# Patient Record
Sex: Male | Born: 1980 | Race: White | Hispanic: No | Marital: Married | Smoking: Never smoker
Health system: Southern US, Community
[De-identification: ages and names within clinical notes are randomized; demographics above are authoritative.]

## PROBLEM LIST (undated history)

## (undated) DIAGNOSIS — K5190 Ulcerative colitis, unspecified, without complications: Secondary | ICD-10-CM

## (undated) DIAGNOSIS — L409 Psoriasis, unspecified: Secondary | ICD-10-CM

## (undated) HISTORY — PX: OTHER SURGICAL HISTORY: SHX169

## (undated) HISTORY — DX: Ulcerative colitis, unspecified, without complications: K51.90

## (undated) HISTORY — DX: Psoriasis, unspecified: L40.9

---
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
## 2004-02-11 ENCOUNTER — Emergency Department (HOSPITAL_COMMUNITY): Admission: EM | Admit: 2004-02-11 | Discharge: 2004-02-12 | Payer: Self-pay | Admitting: Emergency Medicine

## 2013-07-13 ENCOUNTER — Encounter (HOSPITAL_COMMUNITY): Payer: Self-pay | Admitting: Emergency Medicine

## 2013-07-13 ENCOUNTER — Emergency Department (HOSPITAL_COMMUNITY)
Admission: EM | Admit: 2013-07-13 | Discharge: 2013-07-13 | Disposition: A | Discharge status: Home / Self Care | Payer: Self-pay | Attending: Emergency Medicine | Admitting: Emergency Medicine

## 2013-07-13 DIAGNOSIS — N4829 Other inflammatory disorders of penis: Secondary | ICD-10-CM | POA: Insufficient documentation

## 2013-07-13 DIAGNOSIS — N4822 Cellulitis of corpus cavernosum and penis: Secondary | ICD-10-CM

## 2013-07-13 DIAGNOSIS — R42 Dizziness and giddiness: Secondary | ICD-10-CM | POA: Insufficient documentation

## 2013-07-13 DIAGNOSIS — R69 Illness, unspecified: Secondary | ICD-10-CM

## 2013-07-13 DIAGNOSIS — J111 Influenza due to unidentified influenza virus with other respiratory manifestations: Secondary | ICD-10-CM | POA: Insufficient documentation

## 2013-07-13 MED ORDER — NAPROXEN 500 MG PO TABS: 500.0000 mg | ORAL_TABLET | Freq: Two times a day (BID) | ORAL | Status: DC

## 2013-07-13 MED ORDER — SULFAMETHOXAZOLE-TRIMETHOPRIM 800-160 MG PO TABS: 1.0000 | ORAL_TABLET | Freq: Two times a day (BID) | ORAL | Status: DC

## 2013-07-13 MED ORDER — SULFAMETHOXAZOLE-TMP DS 800-160 MG PO TABS
1.0000 | ORAL_TABLET | Freq: Once | ORAL | Status: AC
  Administered 2013-07-13: 1 via ORAL
  Filled 2013-07-13: qty 1

## 2013-07-13 MED ORDER — NAPROXEN 250 MG PO TABS
500.0000 mg | ORAL_TABLET | Freq: Once | ORAL | Status: AC
  Administered 2013-07-13: 500 mg via ORAL
  Filled 2013-07-13: qty 2

## 2013-07-13 NOTE — Discharge Instructions (Signed)
[facility] ° ° ° °Edward Hawkins MD. Specialty: Pulmonary Disease Contact information: [address]  °[address]  °[place] [address]  °[phone]  ° °Margaret Simpson, MD. Specialty: Family Medicine Contact information: [address]  °[place] [address]  °[phone]  ° °Scott Luking, MD. Specialty: Family Medicine Contact information: [address]  °[address]  °[place] [address]  °[phone]  ° °Tesfaye Fanta, MD Specialty: Internal Medicine Contact information: [address]  °[place] [address]  °[phone]  ° °Zach Hall, MD. Specialty: Internal Medicine Contact information: [address]  °[place] [address]  °[phone]  ° °Angus Mcinnis, MD. Specialty: Family Medicine Contact information: [address]  °[place] [address]  °[phone]  ° °Stephen Knowlton, MD. Specialty: Family Medicine Contact information: [address]  °[address]  °[place] [address]  °[phone]  ° °Roy Fagan, MD. Specialty: Internal Medicine Contact information: [address]  °[address]  °[place] [address]  °[phone]  ° ° °

## 2013-07-13 NOTE — ED Notes (Signed)
General malaise, chills, dizziness, and cough began last night.  Noticed swelling in his genitals about 4 hours ago.  Denies nausea, vomiting and diarrhea, denies rash.
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
## 2016-10-21 ENCOUNTER — Emergency Department (HOSPITAL_COMMUNITY): Payer: Self-pay

## 2016-10-21 ENCOUNTER — Emergency Department (HOSPITAL_COMMUNITY)
Admission: EM | Admit: 2016-10-21 | Discharge: 2016-10-21 | Disposition: A | Discharge status: Home / Self Care | Payer: Self-pay | Attending: Emergency Medicine | Admitting: Emergency Medicine

## 2016-10-21 ENCOUNTER — Encounter (HOSPITAL_COMMUNITY): Payer: Self-pay | Admitting: *Deleted

## 2016-10-21 DIAGNOSIS — Y929 Unspecified place or not applicable: Secondary | ICD-10-CM | POA: Insufficient documentation

## 2016-10-21 DIAGNOSIS — X58XXXA Exposure to other specified factors, initial encounter: Secondary | ICD-10-CM | POA: Insufficient documentation

## 2016-10-21 DIAGNOSIS — Y990 Civilian activity done for income or pay: Secondary | ICD-10-CM | POA: Insufficient documentation

## 2016-10-21 DIAGNOSIS — Y939 Activity, unspecified: Secondary | ICD-10-CM | POA: Insufficient documentation

## 2016-10-21 DIAGNOSIS — T63311A Toxic effect of venom of black widow spider, accidental (unintentional), initial encounter: Secondary | ICD-10-CM | POA: Insufficient documentation

## 2016-10-21 DIAGNOSIS — Z79899 Other long term (current) drug therapy: Secondary | ICD-10-CM | POA: Insufficient documentation

## 2016-10-21 DIAGNOSIS — J40 Bronchitis, not specified as acute or chronic: Secondary | ICD-10-CM | POA: Insufficient documentation

## 2016-10-21 LAB — COMPREHENSIVE METABOLIC PANEL
ALBUMIN: 4.6 g/dL (ref 3.5–5.0)
ALT: 40 U/L (ref 17–63)
AST: 30 U/L (ref 15–41)
Alkaline Phosphatase: 72 U/L (ref 38–126)
Anion gap: 9 (ref 5–15)
BUN: 12 mg/dL (ref 6–20)
CO2: 26 mmol/L (ref 22–32)
Calcium: 9.7 mg/dL (ref 8.9–10.3)
Chloride: 105 mmol/L (ref 101–111)
Creatinine, Ser: 1.35 mg/dL — ABNORMAL HIGH (ref 0.61–1.24)
GFR calc Af Amer: >60 mL/min (ref >60)
GFR calc non Af Amer: >60 mL/min (ref >60)
Glucose, Bld: 102 mg/dL — ABNORMAL HIGH (ref 65–99)
POTASSIUM: 4.1 mmol/L (ref 3.5–5.1)
Sodium: 140 mmol/L (ref 135–145)
TOTAL PROTEIN: 8.5 g/dL — AB (ref 6.5–8.1)
Total Bilirubin: 0.7 mg/dL (ref 0.3–1.2)

## 2016-10-21 LAB — CBC WITH DIFFERENTIAL/PLATELET
BASOS PCT: 1 %
Basophils Absolute: 0.1 10*3/uL (ref 0.0–0.1)
EOS PCT: 2 %
Eosinophils Absolute: 0.2 10*3/uL (ref 0.0–0.7)
HCT: 44.5 % (ref 39.0–52.0)
Hemoglobin: 15.2 g/dL (ref 13.0–17.0)
Lymphocytes Relative: 29 %
Lymphs Abs: 3.2 10*3/uL (ref 0.7–4.0)
MCH: 30.4 pg (ref 26.0–34.0)
MCHC: 34.2 g/dL (ref 30.0–36.0)
MCV: 89 fL (ref 78.0–100.0)
MONO ABS: 0.8 10*3/uL (ref 0.1–1.0)
Monocytes Relative: 8 %
Neutro Abs: 6.6 10*3/uL (ref 1.7–7.7)
Neutrophils Relative %: 60 %
Platelets: 278 10*3/uL (ref 150–400)
RBC: 5 MIL/uL (ref 4.22–5.81)
RDW: 13.5 % (ref 11.5–15.5)
WBC: 11 10*3/uL — ABNORMAL HIGH (ref 4.0–10.5)

## 2016-10-21 LAB — I-STAT TROPONIN, ED: Troponin i, poc: 0.01 ng/mL (ref 0.00–0.08)

## 2016-10-21 MED ORDER — LORAZEPAM 2 MG/ML IJ SOLN: 0.5000 mg | Freq: Once | INTRAMUSCULAR | Status: DC

## 2016-10-21 MED ORDER — LORAZEPAM 2 MG/ML IJ SOLN
1.0000 mg | Freq: Once | INTRAMUSCULAR | Status: AC
  Administered 2016-10-21: 1 mg via INTRAVENOUS
  Filled 2016-10-21: qty 1

## 2016-10-21 MED ORDER — SODIUM CHLORIDE 0.9 % IV BOLUS (SEPSIS)
1000.0000 mL | Freq: Once | INTRAVENOUS | Status: AC
Start: 2016-10-21 — End: 2016-10-21
  Administered 2016-10-21: 1000 mL via INTRAVENOUS

## 2016-10-21 MED ORDER — MORPHINE SULFATE (PF) 4 MG/ML IV SOLN: 6.0000 mg | Freq: Once | INTRAVENOUS | Status: DC

## 2016-10-21 MED ORDER — SODIUM CHLORIDE 0.9 % IV BOLUS (SEPSIS)
1000.0000 mL | Freq: Once | INTRAVENOUS | Status: AC
  Administered 2016-10-21: 1000 mL via INTRAVENOUS

## 2016-10-21 MED ORDER — MORPHINE SULFATE (PF) 4 MG/ML IV SOLN
4.0000 mg | Freq: Once | INTRAVENOUS | Status: AC
  Administered 2016-10-21: 4 mg via INTRAVENOUS
  Filled 2016-10-21: qty 1

## 2016-10-21 MED ORDER — CYCLOBENZAPRINE HCL 10 MG PO TABS: 10.0000 mg | ORAL_TABLET | Freq: Three times a day (TID) | ORAL | 0 refills | Status: DC | PRN

## 2016-10-21 MED ORDER — AZITHROMYCIN 250 MG PO TABS: ORAL_TABLET | ORAL | 0 refills | Status: DC

## 2016-10-21 MED ORDER — HYDROCODONE-ACETAMINOPHEN 5-325 MG PO TABS: 1.0000 | ORAL_TABLET | Freq: Four times a day (QID) | ORAL | 0 refills | Status: DC | PRN

## 2016-10-21 MED ORDER — LORAZEPAM 2 MG/ML IJ SOLN
0.5000 mg | Freq: Once | INTRAMUSCULAR | Status: AC
  Administered 2016-10-21: 0.5 mg via INTRAVENOUS
  Filled 2016-10-21: qty 1

## 2016-10-21 MED ORDER — TETANUS-DIPHTH-ACELL PERTUSSIS 5-2.5-18.5 LF-MCG/0.5 IM SUSP
0.5000 mL | Freq: Once | INTRAMUSCULAR | Status: AC
  Administered 2016-10-21: 0.5 mL via INTRAMUSCULAR
  Filled 2016-10-21: qty 0.5

## 2016-10-21 MED ORDER — MORPHINE SULFATE (PF) 4 MG/ML IV SOLN
6.0000 mg | Freq: Once | INTRAVENOUS | Status: AC
  Administered 2016-10-21: 6 mg via INTRAVENOUS
  Filled 2016-10-21: qty 2

## 2016-10-21 MED ORDER — ONDANSETRON HCL 4 MG/2ML IJ SOLN
4.0000 mg | Freq: Once | INTRAMUSCULAR | Status: AC
  Administered 2016-10-21: 4 mg via INTRAVENOUS
  Filled 2016-10-21: qty 2

## 2016-10-21 NOTE — ED Notes (Signed)
Pt ambulatory to waiting room. Pt verbalized understanding of discharge instructions.   

## 2016-10-21 NOTE — Discharge Instructions (Signed)
Follow-up with your family doctor if any problems. Return to the emergency department if necessary. Rest at home tomorrow

## 2016-10-21 NOTE — ED Triage Notes (Signed)
Pt states he was working this morning and went to lunch, he felt something pinch/stick his back and then shook his shirt out. He saw a black widow on the floor. Pt has no bite mark noted. Pt states he is having severe back pain, abdominal pain, and now chest pain. Pt flushed in the face and crying in triage.

## 2016-10-21 NOTE — ED Provider Notes (Signed)
[facility]PT Provider Note   CSN: [id number] Arrival date & time: 10/21/16  1453     History   Chief Complaint Chief Complaint  Patient presents with  . Back Pain    HPI Juan Salazar is a 36 y.o. male.  Patient states that he was bit on the back by a black widow spider. Patient complains of severe pain in his back and abdomen with nausea. Patient stepped on the spider and showed me a picture of it. He was certain it was a black widow   The history is provided by the patient.  Back Pain   This is a new problem. The current episode started 1 to 2 hours ago. The problem occurs constantly. The problem has not changed since onset.Associated with: Insect bite. The pain is present in the lumbar spine. The quality of the pain is described as cramping. The pain does not radiate. The pain is at a severity of 9/10. The pain is severe. The pain is the same all the time. Pertinent negatives include no chest pain, no headaches and no abdominal pain.    History reviewed. No pertinent past medical history.  There are no active problems to display for this patient.   History reviewed. No pertinent surgical history.     Home Medications    Prior to Admission medications   Medication Sig Start Date End Date Taking? Authorizing Provider  dextromethorphan-guaiFENesin (MUCINEX DM) 30-600 MG 12hr tablet Take 1 tablet by mouth 2 (two) times daily as needed for cough.   Yes Historical Provider, MD  azithromycin (ZITHROMAX Z-PAK) 250 MG tablet 2 po day one, then 1 daily x 4 days 10/21/16   Bethann Berkshire, MD  cyclobenzaprine (FLEXERIL) 10 MG tablet Take 1 tablet (10 mg total) by mouth 3 (three) times daily as needed for muscle spasms. 10/21/16   Bethann Berkshire, MD  HYDROcodone-acetaminophen (NORCO/VICODIN) 5-325 MG tablet Take 1 tablet by mouth every 6 (six) hours as needed for moderate pain. 10/21/16   Bethann Berkshire, MD    Family History No family history on file.  Social History Social  History  Substance Use Topics  . Smoking status: Never Smoker  . Smokeless tobacco: Never Used  . Alcohol use Yes     Comment: rarely     Allergies   Patient has no known allergies.   Review of Systems Review of Systems  Constitutional: Negative for appetite change and fatigue.  HENT: Negative for congestion, ear discharge and sinus pressure.   Eyes: Negative for discharge.  Respiratory: Positive for cough.   Cardiovascular: Negative for chest pain.  Gastrointestinal: Negative for abdominal pain and diarrhea.  Genitourinary: Negative for frequency and hematuria.  Musculoskeletal: Positive for back pain.  Skin: Negative for rash.  Neurological: Negative for seizures and headaches.  Psychiatric/Behavioral: Negative for hallucinations.     Physical Exam Updated Vital Signs BP (!) 132/95   Pulse (!) 108   Temp 98.9 F (37.2 C) (Oral)   Resp (!) 29   Ht 5\' 9"  (1.753 m)   Wt 250 lb (113.4 kg)   SpO2 95%   BMI 36.92 kg/m   Physical Exam  Constitutional: He is oriented to person, place, and time. He appears well-developed. He appears distressed.  HENT:  Head: Normocephalic.  Eyes: Conjunctivae and EOM are normal. No scleral icterus.  Neck: Neck supple. No thyromegaly present.  Cardiovascular: Normal rate and regular rhythm.  Exam reveals no gallop and no friction rub.   No murmur heard.  Pulmonary/Chest: No stridor. He has no wheezes. He has no rales. He exhibits no tenderness.  Abdominal: He exhibits no distension. There is no tenderness. There is no rebound.  Musculoskeletal: Normal range of motion. He exhibits no edema.  Lymphadenopathy:    He has no cervical adenopathy.  Neurological: He is oriented to person, place, and time. He exhibits normal muscle tone. Coordination normal.  Skin: No rash noted. No erythema.  Psychiatric: He has a normal mood and affect. His behavior is normal.     ED Treatments / Results  Labs (all labs ordered are listed, but only  abnormal results are displayed) Labs Reviewed  CBC WITH DIFFERENTIAL/PLATELET - Abnormal; Notable for the following:       Result Value   WBC 11.0 (*)    All other components within normal limits  COMPREHENSIVE METABOLIC PANEL - Abnormal; Notable for the following:    Glucose, Bld 102 (*)    Creatinine, Ser 1.35 (*)    Total Protein 8.5 (*)    All other components within normal limits  I-STAT TROPOININ, ED    EKG  EKG Interpretation None       Radiology Dg Chest Portable 1 View  Result Date: 10/21/2016 CLINICAL DATA:  Possible spider bite. EXAM: PORTABLE CHEST 1 VIEW COMPARISON:  None. FINDINGS: Lungs are clear. Heart size is normal. No pneumothorax or pleural effusion. IMPRESSION: Negative chest. Electronically Signed   By: Drusilla Kanner M.D.   On: 10/21/2016 15:34    Procedures Procedures (including critical care time)  Medications Ordered in ED Medications  morphine 4 MG/ML injection 6 mg (6 mg Intravenous Given 10/21/16 1554)  LORazepam (ATIVAN) injection 1 mg (1 mg Intravenous Given 10/21/16 1554)  ondansetron (ZOFRAN) injection 4 mg (4 mg Intravenous Given 10/21/16 1553)  sodium chloride 0.9 % bolus 1,000 mL (0 mLs Intravenous Stopped 10/21/16 1814)  Tdap (BOOSTRIX) injection 0.5 mL (0.5 mLs Intramuscular Given 10/21/16 1556)  morphine 4 MG/ML injection 4 mg (4 mg Intravenous Given 10/21/16 1650)  LORazepam (ATIVAN) injection 0.5 mg (0.5 mg Intravenous Given 10/21/16 1650)  morphine 4 MG/ML injection 6 mg (6 mg Intravenous Given 10/21/16 1809)  LORazepam (ATIVAN) injection 0.5 mg (0.5 mg Intravenous Given 10/21/16 1809)  sodium chloride 0.9 % bolus 1,000 mL (1,000 mLs Intravenous New Bag/Given 10/21/16 1855)     Initial Impression / Assessment and Plan / ED Course  I have reviewed the triage vital signs and the nursing notes.  Pertinent labs & imaging results that were available during my care of the patient were reviewed by me and considered in my medical decision making (see  chart for details).    CRITICAL CARE Performed by: Aliz Meritt L Total critical care time: Critical care time was exclusive of separately billable procedures and treating other patients. Critical care was necessary to treat or prevent imminent or life-threatening deterioration. Critical care was time spent personally by me on the following activities: development of treatment plan with patient and/or surrogate as well as nursing, discussions with consultants, evaluation of patient's response to treatment, examination of patient, obtaining history from patient or surrogate, ordering and performing treatments and interventions, ordering and review of laboratory studies, ordering and review of radiographic studies, pulse oximetry and re-evaluation of patient's condition.   Patient with a black widow spider bite to his back. Patient was in severe pain. Patient received 3 doses of morphine and 3 doses of Ativan. Finally his pain was relieved. Patient was monitored for 4 hours after that  and felt better. Patient also has a bronchitis from 2 days ago. He'll be discharged home with hydrocodone Flexeril Z-Pak  Final Clinical Impressions(s) / ED Diagnoses   Final diagnoses:  Black widow spider bite, accidental or unintentional, initial encounter  Bronchitis    New Prescriptions New Prescriptions   AZITHROMYCIN (ZITHROMAX Z-PAK) 250 MG TABLET    2 po day one, then 1 daily x 4 days   CYCLOBENZAPRINE (FLEXERIL) 10 MG TABLET    Take 1 tablet (10 mg total) by mouth 3 (three) times daily as needed for muscle spasms.   HYDROCODONE-ACETAMINOPHEN (NORCO/VICODIN) 5-325 MG TABLET    Take 1 tablet by mouth every 6 (six) hours as needed for moderate pain.     Bethann BerkshireJoseph Romney Compean, MD 10/21/16 [phone]

## 2020-01-03 ENCOUNTER — Emergency Department (HOSPITAL_COMMUNITY)
Admission: EM | Admit: 2020-01-03 | Discharge: 2020-01-03 | Disposition: A | Discharge status: Home / Self Care | Payer: Self-pay | Attending: Emergency Medicine | Admitting: Emergency Medicine

## 2020-01-03 ENCOUNTER — Other Ambulatory Visit: Payer: Self-pay

## 2020-01-03 DIAGNOSIS — M791 Myalgia, unspecified site: Secondary | ICD-10-CM | POA: Insufficient documentation

## 2020-01-03 DIAGNOSIS — Z5321 Procedure and treatment not carried out due to patient leaving prior to being seen by health care provider: Secondary | ICD-10-CM | POA: Insufficient documentation

## 2020-01-03 DIAGNOSIS — E860 Dehydration: Secondary | ICD-10-CM | POA: Insufficient documentation

## 2020-01-03 NOTE — ED Triage Notes (Signed)
Pt reports generalized muscle aches since last Friday. Pt reports is outside working all the time and wanted to make sure wasn't becoming dehydrated. nad noted. Pt denies any fever, gi/gu symptoms.

## 2020-01-06 ENCOUNTER — Ambulatory Visit (INDEPENDENT_AMBULATORY_CARE_PROVIDER_SITE_OTHER): Payer: Self-pay

## 2020-01-06 ENCOUNTER — Other Ambulatory Visit: Payer: Self-pay

## 2020-01-06 ENCOUNTER — Ambulatory Visit
Admission: EM | Admit: 2020-01-06 | Discharge: 2020-01-06 | Disposition: A | Discharge status: Home / Self Care | Payer: Self-pay

## 2020-01-06 ENCOUNTER — Encounter: Payer: Self-pay | Admitting: Emergency Medicine

## 2020-01-06 ENCOUNTER — Ambulatory Visit: Payer: Self-pay

## 2020-01-06 DIAGNOSIS — M7989 Other specified soft tissue disorders: Secondary | ICD-10-CM

## 2020-01-06 DIAGNOSIS — M79672 Pain in left foot: Secondary | ICD-10-CM

## 2020-01-06 DIAGNOSIS — M25472 Effusion, left ankle: Secondary | ICD-10-CM

## 2020-01-06 DIAGNOSIS — M25572 Pain in left ankle and joints of left foot: Secondary | ICD-10-CM

## 2020-01-06 MED ORDER — PREDNISONE 10 MG (21) PO TBPK: ORAL_TABLET | Freq: Every day | ORAL | 0 refills | Status: DC

## 2020-01-06 NOTE — ED Provider Notes (Signed)
[facility]   [id number] 01/06/20 Arrival Time: 1139  CC: LT foot pain  SUBJECTIVE: History from: patient. Juan Salazar is a 39 y.o. male complains of LT foot pain x 1 week.  Denies a precipitating event or specific injury.  Localizes the pain to the ankle and outside of foot.  Describes the pain as intermittent and 8/10.  Has tried OTC medications without relief.  Symptoms are made worse with weight-bearing.  Denies similar symptoms in the past.  Complains of associated swelling.  Also reports multiple joint pain that has improved.  Denies fever, chills, erythema, ecchymosis, weakness, numbness and tingling.  ROS: As per HPI.  All other pertinent ROS negative.     History reviewed. No pertinent past medical history. History reviewed. No pertinent surgical history. No Known Allergies No current facility-administered medications on file prior to encounter.   Current Outpatient Medications on File Prior to Encounter  Medication Sig Dispense Refill  . ibuprofen (ADVIL) 200 MG tablet Take 200 mg by mouth every 6 (six) hours as needed.     Social History   Socioeconomic History  . Marital status: Married    Spouse name: Not on file  . Number of children: Not on file  . Years of education: Not on file  . Highest education level: Not on file  Occupational History  . Not on file  Tobacco Use  . Smoking status: Never Smoker  . Smokeless tobacco: Never Used  Substance and Sexual Activity  . Alcohol use: Yes    Comment: rarely  . Drug use: No  . Sexual activity: Not on file  Other Topics Concern  . Not on file  Social History Narrative  . Not on file   Social Determinants of Health   Financial Resource Strain:   . Difficulty of Paying Living Expenses:   Food Insecurity:   . Worried About Programme researcher, broadcasting/film/video in the Last Year:   . Barista in the Last Year:   Transportation Needs:   . Freight forwarder (Medical):   Marland Kitchen Lack of Transportation  (Non-Medical):   Physical Activity:   . Days of Exercise per Week:   . Minutes of Exercise per Session:   Stress:   . Feeling of Stress :   Social Connections:   . Frequency of Communication with Friends and Family:   . Frequency of Social Gatherings with Friends and Family:   . Attends Religious Services:   . Active Member of Clubs or Organizations:   . Attends Banker Meetings:   Marland Kitchen Marital Status:   Intimate Partner Violence:   . Fear of Current or Ex-Partner:   . Emotionally Abused:   Marland Kitchen Physically Abused:   . Sexually Abused:    No family history on file.  OBJECTIVE:  Vitals:   01/06/20 1147 01/06/20 1148  BP: 125/79   Pulse: 86   Resp: 18   Temp: 98.3 F (36.8 C)   TempSrc: Oral   SpO2: 97%   Weight:  255 lb (115.7 kg)    General appearance: ALERT; in no acute distress.  Head: NCAT Lungs: Normal respiratory effort CV: Dorsalis pedis pulse 2+ . Cap refill < 2 seconds Musculoskeletal: LT ankle/ foot Inspection: Diffuse swelling about the ankle Palpation: diffusely TTP over lateral and medial ankle; TTP over proximal fifth MTs ROM: FROM active and passive Strength: deferred Skin: warm and dry Neurologic: Ambulates with antalgic gait; Sensation intact about the lower extremities Psychological: alert  and cooperative; normal mood and affect  DIAGNOSTIC STUDIES:  DG Foot Complete Left  Result Date: 01/06/2020 CLINICAL DATA:  Lateral left foot pain for 1 week, no known injury EXAM: LEFT FOOT - COMPLETE 3+ VIEW COMPARISON:  None. FINDINGS: There is no evidence of fracture or dislocation. There is no evidence of arthropathy or other focal bone abnormality. Soft tissues are unremarkable. IMPRESSION: No fracture or dislocation of the left foot. Joint spaces are preserved. Electronically Signed   By: Lauralyn Primes M.D.   On: 01/06/2020 12:28    X-rays negative for bony abnormalities including fracture, or dislocation.  No soft tissue swelling.    I have  reviewed the x-rays myself and the radiologist interpretation. I am in agreement with the radiologist interpretation.     ASSESSMENT & PLAN:  1. Left foot pain   2. Swelling of left foot   3. Acute left ankle pain   4. Left ankle swelling     Meds ordered this encounter  Medications  . predniSONE (STERAPRED UNI-PAK 21 TAB) 10 MG (21) TBPK tablet    Sig: Take by mouth daily. Take 6 tabs by mouth daily  for 2 days, then 5 tabs for 2 days, then 4 tabs for 2 days, then 3 tabs for 2 days, 2 tabs for 2 days, then 1 tab by mouth daily for 2 days    Dispense:  42 tablet    Refill:  0    Order Specific Question:   Supervising Provider    Answer:   Eustace Moore [[id number]]   X-rays negative for fracture or dislocation Continue conservative management of rest, ice, and elevation Prednisone prescribed.  Take as directed and to completion Follow up with PCP for further evaluation and management Return or go to the ER if you have any new or worsening symptoms (fever, chills, chest pain, abdominal pain, changes in bowel or bladder habits, pain radiating into lower legs, etc...)   Reviewed expectations re: course of current medical issues. Questions answered. Outlined signs and symptoms indicating need for more acute intervention. Patient verbalized understanding. After Visit Summary given.    Rennis Harding, PA-C 01/06/20 1234

## 2020-01-06 NOTE — Discharge Instructions (Signed)
X-rays negative for fracture or dislocation Continue conservative management of rest, ice, and elevation Prednisone prescribed.  Take as directed and to completion Follow up with PCP for further evaluation and management Return or go to the ER if you have any new or worsening symptoms (fever, chills, chest pain, abdominal pain, changes in bowel or bladder habits, pain radiating into lower legs, etc...)

## 2020-01-06 NOTE — ED Triage Notes (Signed)
Left foot pain for about 1 week with no injury noted.

## 2020-01-25 ENCOUNTER — Ambulatory Visit
Admission: EM | Admit: 2020-01-25 | Discharge: 2020-01-25 | Disposition: A | Discharge status: Home / Self Care | Payer: Self-pay | Attending: Emergency Medicine | Admitting: Emergency Medicine

## 2020-01-25 ENCOUNTER — Other Ambulatory Visit: Payer: Self-pay

## 2020-01-25 ENCOUNTER — Encounter: Payer: Self-pay | Admitting: Emergency Medicine

## 2020-01-25 DIAGNOSIS — R509 Fever, unspecified: Secondary | ICD-10-CM

## 2020-01-25 DIAGNOSIS — M25579 Pain in unspecified ankle and joints of unspecified foot: Secondary | ICD-10-CM

## 2020-01-25 DIAGNOSIS — M25532 Pain in left wrist: Secondary | ICD-10-CM

## 2020-01-25 MED ORDER — DEXAMETHASONE SODIUM PHOSPHATE 10 MG/ML IJ SOLN
10.0000 mg | Freq: Once | INTRAMUSCULAR | Status: AC
  Administered 2020-01-25: 10 mg via INTRAMUSCULAR

## 2020-01-25 MED ORDER — PREDNISONE 10 MG (21) PO TBPK: ORAL_TABLET | Freq: Every day | ORAL | 0 refills | Status: DC

## 2020-01-25 NOTE — ED Triage Notes (Addendum)
Pain in wrist and ankles x 1 month. Was seen here and given prednisone.  Pt states he felt better while he was on prednisone but started to hurt again. Pt has appointment with pcp on Wed.

## 2020-01-25 NOTE — Discharge Instructions (Signed)
Steroid shot given in office Continue conservative management of rest, ice, and gentle stretches Prednisone prescribed.  Take as directed and to completion Follow up with PCP this week for further evaluation and managmenet Return or go to the ER if you have any new or worsening symptoms (fever, chills, chest pain, redness, swelling, bruising, etc...)

## 2020-01-25 NOTE — ED Provider Notes (Signed)
[facility]   [id number] 01/25/20 Arrival Time: 1506  CC: Multiple joint PAIN  SUBJECTIVE: History from: patient. Juan Salazar is a 39 y.o. male complains of multiple joint pain.  Bilateral wrist pain the worse today.  Flare x 4 days.  Denies a precipitating event or specific injury.  Pain diffuse about the wrist.  Describes the pain as intermittent and 5/10 in character.  Reports relief with prednisone.  Symptoms are made worse with movemetn.  Denies similar symptoms in the past.  Denies fever, chills, erythema, ecchymosis, weakness, numbness and tingling.   Also requests covid test  ROS: As per HPI.  All other pertinent ROS negative.     History reviewed. No pertinent past medical history. History reviewed. No pertinent surgical history. No Known Allergies No current facility-administered medications on file prior to encounter.   Current Outpatient Medications on File Prior to Encounter  Medication Sig Dispense Refill  . ibuprofen (ADVIL) 200 MG tablet Take 200 mg by mouth every 6 (six) hours as needed.     Social History   Socioeconomic History  . Marital status: Married    Spouse name: Not on file  . Number of children: Not on file  . Years of education: Not on file  . Highest education level: Not on file  Occupational History  . Not on file  Tobacco Use  . Smoking status: Never Smoker  . Smokeless tobacco: Never Used  Substance and Sexual Activity  . Alcohol use: Yes    Comment: rarely  . Drug use: No  . Sexual activity: Not on file  Other Topics Concern  . Not on file  Social History Narrative  . Not on file   Social Determinants of Health   Financial Resource Strain:   . Difficulty of Paying Living Expenses:   Food Insecurity:   . Worried About Programme researcher, broadcasting/film/video in the Last Year:   . Barista in the Last Year:   Transportation Needs:   . Freight forwarder (Medical):   Marland Kitchen Lack of Transportation (Non-Medical):   Physical Activity:    . Days of Exercise per Week:   . Minutes of Exercise per Session:   Stress:   . Feeling of Stress :   Social Connections:   . Frequency of Communication with Friends and Family:   . Frequency of Social Gatherings with Friends and Family:   . Attends Religious Services:   . Active Member of Clubs or Organizations:   . Attends Banker Meetings:   Marland Kitchen Marital Status:   Intimate Partner Violence:   . Fear of Current or Ex-Partner:   . Emotionally Abused:   Marland Kitchen Physically Abused:   . Sexually Abused:    No family history on file.  OBJECTIVE:  Vitals:   01/25/20 1532 01/25/20 1534  BP:  104/63  Pulse:  (!) 110  Resp:  18  Temp:  100.2 F (37.9 C)  TempSrc:  Oral  SpO2:  96%  Weight: 253 lb 8.5 oz (115 kg)   Height: 5\' 9"  (1.753 m)     General appearance: ALERT; in no acute distress.  Head: NCAT Lungs: Normal respiratory effort CV: radial pulse 2+ Musculoskeletal: Wrist Inspection: Skin warm, dry, clear and intact without obvious erythema, effusion, or ecchymosis.  Palpation: Nontender to palpation ROM: FROM active and passive Strength:  5/5 grip strength Skin: warm and dry Neurologic: Ambulates without difficulty; Sensation intact about the upper extremities Psychological: alert and cooperative;  normal mood and affect  ASSESSMENT & PLAN:  1. Fever, unspecified   2. Pain in joint involving ankle and foot, unspecified laterality   3. Pain in both wrists    Meds ordered this encounter  Medications  . predniSONE (STERAPRED UNI-PAK 21 TAB) 10 MG (21) TBPK tablet    Sig: Take by mouth daily. Take 6 tabs by mouth daily  for 2 days, then 5 tabs for 2 days, then 4 tabs for 2 days, then 3 tabs for 2 days, 2 tabs for 2 days, then 1 tab by mouth daily for 2 days    Dispense:  42 tablet    Refill:  0    Order Specific Question:   Supervising Provider    Answer:   Eustace Moore [[id number]]  . dexamethasone (DECADRON) injection 10 mg   Steroid shot given in  office Continue conservative management of rest, ice, and gentle stretches Prednisone prescribed.  Take as directed and to completion Follow up with PCP this week for further evaluation and managmenet Return or go to the ER if you have any new or worsening symptoms (fever, chills, chest pain, redness, swelling, bruising, etc...)   Reviewed expectations re: course of current medical issues. Questions answered. Outlined signs and symptoms indicating need for more acute intervention. Patient verbalized understanding. After Visit Summary given.    Rennis Harding, PA-C 01/25/20 1621

## 2020-01-26 LAB — SARS-COV-2, NAA 2 DAY TAT

## 2020-01-26 LAB — NOVEL CORONAVIRUS, NAA: SARS-CoV-2, NAA: NOT DETECTED

## 2020-01-29 ENCOUNTER — Encounter: Payer: Self-pay | Admitting: Physician Assistant

## 2020-01-29 ENCOUNTER — Ambulatory Visit: Payer: Self-pay | Admitting: Physician Assistant

## 2020-01-29 DIAGNOSIS — Z131 Encounter for screening for diabetes mellitus: Secondary | ICD-10-CM

## 2020-01-29 DIAGNOSIS — L409 Psoriasis, unspecified: Secondary | ICD-10-CM

## 2020-01-29 DIAGNOSIS — Z13220 Encounter for screening for lipoid disorders: Secondary | ICD-10-CM

## 2020-01-29 DIAGNOSIS — M2550 Pain in unspecified joint: Secondary | ICD-10-CM

## 2020-01-29 DIAGNOSIS — Z7689 Persons encountering health services in other specified circumstances: Secondary | ICD-10-CM

## 2020-01-29 DIAGNOSIS — E669 Obesity, unspecified: Secondary | ICD-10-CM

## 2020-01-29 NOTE — Patient Instructions (Signed)
Preventing Unhealthy Weight Gain, Adult Staying at a healthy weight is important to your overall health. When fat builds up in your body, you may become overweight or obese. Being overweight or obese increases your risk of developing certain health problems, such as heart disease, diabetes, sleeping problems, joint problems, and some types of cancer. Unhealthy weight gain is often the result of making unhealthy food choices or not getting enough exercise. You can make changes to your lifestyle to prevent obesity and stay as healthy as possible. What nutrition changes can be made?   Eat only as much as your body needs. To do this: ? Pay attention to signs that you are hungry or full. Stop eating as soon as you feel full. ? If you feel hungry, try drinking water first before eating. Drink enough water so your urine is clear or pale yellow. ? Eat smaller portions. Pay attention to portion sizes when eating out. ? Look at serving sizes on food labels. Most foods contain more than one serving per container. ? Eat the recommended number of calories for your gender and activity level. For most active people, a daily total of 2,000 calories is appropriate. If you are trying to lose weight or are not very active, you may need to eat fewer calories. Talk with your health care provider or a diet and nutrition specialist (dietitian) about how many calories you need each day.  Choose healthy foods, such as: ? Fruits and vegetables. At each meal, try to fill at least half of your plate with fruits and vegetables. ? Whole grains, such as whole-wheat bread, brown rice, and quinoa. ? Lean meats, such as chicken or fish. ? Other healthy proteins, such as beans, eggs, or tofu. ? Healthy fats, such as nuts, seeds, fatty fish, and olive oil. ? Low-fat or fat-free dairy products.  Check food labels, and avoid food and drinks that: ? Are high in calories. ? Have added sugar. ? Are high in sodium. ? Have saturated  fats or trans fats.  Cook foods in healthier ways, such as by baking, broiling, or grilling.  Make a meal plan for the week, and shop with a grocery list to help you stay on track with your purchases. Try to avoid going to the grocery store when you are hungry.  When grocery shopping, try to shop around the outside of the store first, where the fresh foods are. Doing this helps you to avoid prepackaged foods, which can be high in sugar, salt (sodium), and fat. What lifestyle changes can be made?   Exercise for 30 or more minutes on 5 or more days each week. Exercising may include brisk walking, yard work, biking, running, swimming, and team sports like basketball and soccer. Ask your health care provider which exercises are safe for you.  Do muscle-strengthening activities, such as lifting weights or using resistance bands, on 2 or more days a week.  Do not use any products that contain nicotine or tobacco, such as cigarettes and e-cigarettes. If you need help quitting, ask your health care provider.  Limit alcohol intake to no more than 1 drink a day for nonpregnant women and 2 drinks a day for men. One drink equals 12 oz of beer, 5 oz of wine, or 1 oz of hard liquor.  Try to get 7-9 hours of sleep each night. What other changes can be made?  Keep a food and activity journal to keep track of: ? What you ate and how many calories   you had. Remember to count the calories in sauces, dressings, and side dishes. ? Whether you were active, and what exercises you did. ? Your calorie, weight, and activity goals.  Check your weight regularly. Track any changes. If you notice you have gained weight, make changes to your diet or activity routine.  Avoid taking weight-loss medicines or supplements. Talk to your health care provider before starting any new medicine or supplement.  Talk to your health care provider before trying any new diet or exercise plan. Why are these changes  important? Eating healthy, staying active, and having healthy habits can help you to prevent obesity. Those changes also:  Help you manage stress and emotions.  Help you connect with friends and family.  Improve your self-esteem.  Improve your sleep.  Prevent long-term health problems. What can happen if changes are not made? Being obese or overweight can cause you to develop joint or bone problems, which can make it hard for you to stay active or do activities you enjoy. Being obese or overweight also puts stress on your heart and lungs and can lead to health problems like diabetes, heart disease, and some cancers. Where to find more information Talk with your health care provider or a dietitian about healthy eating and healthy lifestyle choices. You may also find information from:  U.S. Department of Agriculture, MyPlate: www.choosemyplate.gov  American Heart Association: www.heart.org  Centers for Disease Control and Prevention: www.cdc.gov Summary  Staying at a healthy weight is important to your overall health. It helps you to prevent certain diseases and health problems, such as heart disease, diabetes, joint problems, sleep disorders, and some types of cancer.  Being obese or overweight can cause you to develop joint or bone problems, which can make it hard for you to stay active or do activities you enjoy.  You can prevent unhealthy weight gain by eating a healthy diet, exercising regularly, not smoking, limiting alcohol, and getting enough sleep.  Talk with your health care provider or a dietitian for guidance about healthy eating and healthy lifestyle choices. This information is not intended to replace advice given to you by your health care provider. Make sure you discuss any questions you have with your health care provider. Document Revised: 06/09/2017 Document Reviewed: 07/13/2016 Elsevier Patient Education  2020 Elsevier Inc.  

## 2020-01-29 NOTE — Progress Notes (Signed)
There were no vitals taken for this visit.   Subjective:    Patient ID: Juan Salazar, male    DOB: 1980-08-15, 39 y.o.   MRN: [id number]  HPI: Juan Salazar is a 39 y.o. male presenting on 01/29/2020 for No chief complaint on file.   HPI   This is a telemedicine appointment through [employer] due to coronavirus pandemic.    I connected with  Juan Salazar on 01/29/20 by a video enabled telemedicine application and verified that I am speaking with the correct person using two identifiers.   I discussed the limitations of evaluation and management by telemedicine. The patient expressed understanding and agreed to proceed.  Pt is in his parked car.  Provider is in office.   Pt was scheduled for in-office appointment today but due to him having documented fever 3 days ago with no definite cause, it was changed to virtual appointment.  Pt presents to establish care.   He says he has had No medical care except urgent care in a long time.    Pt was seen at Urgent care 2 times recently for joint pains.  He also had fever at urgent care on Saturday.   Pt says his Joint pains-  Ankles and wrists mostly- sometimes in his knees.  He wonders if it's related to his psoriasis.  He says he has No swelling of the joints- just pain.  Pt says his Psoriasis rash is not bad, mostly on his knees.    Pt works as a Nutritional therapist  So this makes things difficult for his joint pains.    Pt has Not yet received covid vaccination.      Relevant past medical, surgical, family and social history reviewed and updated as indicated. Interim medical history since our last visit reviewed. Allergies and medications reviewed and updated.   Current Outpatient Medications:  .  predniSONE (STERAPRED UNI-PAK 21 TAB) 10 MG (21) TBPK tablet, Take by mouth daily. Take 6 tabs by mouth daily  for 2 days, then 5 tabs for 2 days, then 4 tabs for 2 days, then 3 tabs for 2 days, 2 tabs for 2 days, then 1 tab by mouth daily for  2 days, Disp: 42 tablet, Rfl: 0 .  ibuprofen (ADVIL) 200 MG tablet, Take 200 mg by mouth every 6 (six) hours as needed. (Patient not taking: Reported on 01/29/2020), Disp: , Rfl:     Review of Systems  Per HPI unless specifically indicated above     Objective:    There were no vitals taken for this visit.  Wt Readings from Last 3 Encounters:  01/25/20 253 lb 8.5 oz (115 kg)  01/06/20 255 lb (115.7 kg)  01/03/20 255 lb (115.7 kg)    Vitals done 01/25/20 at urgent care reviewed  Physical Exam Constitutional:      General: He is not in acute distress.    Appearance: He is not ill-appearing.  HENT:     Head: Normocephalic and atraumatic.  Pulmonary:     Effort: No respiratory distress.  Neurological:     Mental Status: He is alert and oriented to person, place, and time.  Psychiatric:        Attention and Perception: Attention normal.        Mood and Affect: Mood normal.        Speech: Speech normal.        Behavior: Behavior normal. Behavior is cooperative.     Comments: Very pleasant and  conversant.            Assessment & Plan:    Encounter Diagnoses  Name Primary?  . Encounter to establish care Yes  . Psoriasis   . Arthralgia, unspecified joint   . Screening cholesterol level   . Screening for diabetes mellitus   . Obesity, unspecified classification, unspecified obesity type, unspecified whether serious comorbidity present      -will get baseline Labs including tests to check for rheumatologic cause of arthralgias -encouraged weight loss with healthy diet and regular exercise to help overall health in addition to helping his joints.  He was given some reading information on this subject -encouraged pt to get covid vaccination -pt to Follow up 1 month.  He is to contact office sooner prn

## 2020-02-04 ENCOUNTER — Other Ambulatory Visit: Payer: Self-pay

## 2020-02-04 ENCOUNTER — Other Ambulatory Visit (HOSPITAL_COMMUNITY)
Admission: RE | Admit: 2020-02-04 | Discharge: 2020-02-04 | Disposition: A | Discharge status: Home / Self Care | Payer: Self-pay | Source: Ambulatory Visit | Attending: Physician Assistant | Admitting: Physician Assistant

## 2020-02-04 DIAGNOSIS — M2550 Pain in unspecified joint: Secondary | ICD-10-CM | POA: Insufficient documentation

## 2020-02-04 DIAGNOSIS — Z13220 Encounter for screening for lipoid disorders: Secondary | ICD-10-CM | POA: Insufficient documentation

## 2020-02-04 DIAGNOSIS — Z131 Encounter for screening for diabetes mellitus: Secondary | ICD-10-CM | POA: Insufficient documentation

## 2020-02-04 LAB — COMPREHENSIVE METABOLIC PANEL
ALT: 33 U/L (ref 0–44)
AST: 17 U/L (ref 15–41)
Albumin: 3.7 g/dL (ref 3.5–5.0)
Alkaline Phosphatase: 62 U/L (ref 38–126)
Anion gap: 10 (ref 5–15)
BUN: 16 mg/dL (ref 6–20)
CO2: 27 mmol/L (ref 22–32)
Calcium: 9 mg/dL (ref 8.9–10.3)
Chloride: 102 mmol/L (ref 98–111)
Creatinine, Ser: 1.19 mg/dL (ref 0.61–1.24)
GFR calc Af Amer: >60 mL/min (ref >60)
GFR calc non Af Amer: >60 mL/min (ref >60)
Glucose, Bld: 94 mg/dL (ref 70–99)
Potassium: 4.5 mmol/L (ref 3.5–5.1)
Sodium: 139 mmol/L (ref 135–145)
Total Bilirubin: 0.5 mg/dL (ref 0.3–1.2)
Total Protein: 7.1 g/dL (ref 6.5–8.1)

## 2020-02-04 LAB — SEDIMENTATION RATE: Sed Rate: 20 mm/hr — ABNORMAL HIGH (ref 0–16)

## 2020-02-04 LAB — LIPID PANEL
Cholesterol: 183 mg/dL (ref 0–200)
HDL: 46 mg/dL (ref >40)
LDL Cholesterol: 98 mg/dL (ref 0–99)
Total CHOL/HDL Ratio: 4 RATIO
Triglycerides: 196 mg/dL — ABNORMAL HIGH (ref <150)
VLDL: 39 mg/dL (ref 0–40)

## 2020-02-04 LAB — C-REACTIVE PROTEIN: CRP: 0.7 mg/dL (ref <1.0)

## 2020-02-04 LAB — HEMOGLOBIN A1C
Hgb A1c MFr Bld: 6 % — ABNORMAL HIGH (ref 4.8–5.6)
Mean Plasma Glucose: 125.5 mg/dL

## 2020-02-04 LAB — URIC ACID: Uric Acid, Serum: 7.1 mg/dL (ref 3.7–8.6)

## 2020-02-05 LAB — RHEUMATOID FACTOR: Rheumatoid fact SerPl-aCnc: <10 IU/mL (ref 0.0–13.9)

## 2020-02-11 ENCOUNTER — Other Ambulatory Visit: Payer: Self-pay

## 2020-02-11 ENCOUNTER — Encounter: Payer: Self-pay | Admitting: Physician Assistant

## 2020-02-11 ENCOUNTER — Ambulatory Visit: Payer: Self-pay | Admitting: Physician Assistant

## 2020-02-11 VITALS — BP 130/92 | HR 78 | Temp 97.7°F | Ht 67.75 in | Wt 247.0 lb

## 2020-02-11 DIAGNOSIS — E669 Obesity, unspecified: Secondary | ICD-10-CM

## 2020-02-11 DIAGNOSIS — R7303 Prediabetes: Secondary | ICD-10-CM

## 2020-02-11 DIAGNOSIS — B353 Tinea pedis: Secondary | ICD-10-CM

## 2020-02-11 NOTE — Patient Instructions (Addendum)
FLU SHOT Mon. Sept 13, 2021 4:30PM  ------------------------------------   Prediabetes Prediabetes is the condition of having a blood sugar (blood glucose) level that is higher than it should be, but not high enough for you to be diagnosed with type 2 diabetes. Having prediabetes puts you at risk for developing type 2 diabetes (type 2 diabetes mellitus). Prediabetes may be called impaired glucose tolerance or impaired fasting glucose. Prediabetes usually does not cause symptoms. Your health care provider can diagnose this condition with blood tests. You may be tested for prediabetes if you are overweight and if you have at least one other risk factor for prediabetes. What is blood glucose, and how is it measured? Blood glucose refers to the amount of glucose in your bloodstream. Glucose comes from eating foods that contain sugars and starches (carbohydrates), which the body breaks down into glucose. Your blood glucose level may be measured in mg/dL (milligrams per deciliter) or mmol/L (millimoles per liter). Your blood glucose may be checked with one or more of the following blood tests:  A fasting blood glucose (FBG) test. You will not be allowed to eat (you will fast) for 8 hours or longer before a blood sample is taken. ? A normal range for FBG is 70-100 mg/dl (3.4-1.9 mmol/L).  An A1c (hemoglobin A1c) blood test. This test provides information about blood glucose control over the previous 2?32months.  An oral glucose tolerance test (OGTT). This test measures your blood glucose at two times: ? After fasting. This is your baseline level. ? Two hours after you drink a beverage that contains glucose. You may be diagnosed with prediabetes:  If your FBG is 100?125 mg/dL (6.2-2.2 mmol/L).  If your A1c level is 5.7?6.4%.  If your OGTT result is 140?199 mg/dL (9.7-98 mmol/L). These blood tests may be repeated to confirm your diagnosis. How can this condition affect me? The pancreas produces  a hormone (insulin) that helps to move glucose from the bloodstream into cells. When cells in the body do not respond properly to insulin that the body makes (insulin resistance), excess glucose builds up in the blood instead of going into cells. As a result, high blood glucose (hyperglycemia) can develop, which can cause many complications. Hyperglycemia is a symptom of prediabetes. Having high blood glucose for a long time is dangerous. Too much glucose in your blood can damage your nerves and blood vessels. Long-term damage can lead to complications from diabetes, which may include:  Heart disease.  Stroke.  Blindness.  Kidney disease.  Depression.  Poor circulation in the feet and legs, which could lead to surgical removal (amputation) in severe cases. What can increase my risk? Risk factors for prediabetes include:  Having a family member with type 2 diabetes.  Being overweight or obese.  Being older than age 13.  Being of American [place], African-American, Hispanic/Latino, or Asian/Pacific Islander descent.  Having an inactive (sedentary) lifestyle.  Having a history of heart disease.  History of gestational diabetes or polycystic ovary syndrome (PCOS), in women.  Having low levels of good cholesterol (HDL-C) or high levels of blood fats (triglycerides).  Having high blood pressure. What actions can I take to prevent diabetes?      Be physically active. ? Do moderate-intensity physical activity for 30 or more minutes on 5 or more days of the week, or as much as told by your health care provider. This could be brisk walking, biking, or water aerobics. ? Ask your health care provider what activities are safe for  you. A mix of physical activities may be best, such as walking, swimming, cycling, and strength training.  Lose weight as told by your health care provider. ? Losing 5-7% of your body weight can reverse insulin resistance. ? Your health care provider can  determine how much weight loss is best for you and can help you lose weight safely.  Follow a healthy meal plan. This includes eating lean proteins, complex carbohydrates, fresh fruits and vegetables, low-fat dairy products, and healthy fats. ? Follow instructions from your health care provider about eating or drinking restrictions. ? Make an appointment to see a diet and nutrition specialist (registered dietitian) to help you create a healthy eating plan that is right for you.  Do not smoke or use any tobacco products, such as cigarettes, chewing tobacco, and e-cigarettes. If you need help quitting, ask your health care provider.  Take over-the-counter and prescription medicines as told by your health care provider. You may be prescribed medicines that help lower the risk of type 2 diabetes.  Keep all follow-up visits as told by your health care provider. This is important. Summary  Prediabetes is the condition of having a blood sugar (blood glucose) level that is higher than it should be, but not high enough for you to be diagnosed with type 2 diabetes.  Having prediabetes puts you at risk for developing type 2 diabetes (type 2 diabetes mellitus).  To help prevent type 2 diabetes, make lifestyle changes such as being physically active and eating a healthy diet. Lose weight as told by your health care provider. This information is not intended to replace advice given to you by your health care provider. Make sure you discuss any questions you have with your health care provider. Document Revised: 09/28/2018 Document Reviewed: 07/28/2015 Elsevier Patient Education  2020 [employer].

## 2020-02-11 NOTE — Progress Notes (Signed)
BP (!) 130/92   Pulse 78   Temp 97.7 F (36.5 C)   Ht 5' 7.75" (1.721 m)   Wt 247 lb (112 kg)   SpO2 97%   BMI 37.83 kg/m    Subjective:    Patient ID: Juan Salazar, male    DOB: 10/31/1980, 39 y.o.   MRN: [id number]  HPI: Juan Salazar is a 39 y.o. male presenting on 02/11/2020 for Follow-up   HPI    Pt had a negative covid 19 screening questionaire.   Pt is 39yoM with appointment to follow up today.  His new patient appointment one month ago was virtual due to fever.   Since that time, pt has gotten his first dose of covid vaccination.   He is feeling well today but is a bit anxious about his lab results.  He says the prednisone helped his joints and it also helped his psoriasis.  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  He tries to wear knee pads.    Relevant past medical, surgical, family and social history reviewed and updated as indicated. Interim medical history since our last visit reviewed. Allergies and medications reviewed and updated.   Current Outpatient Medications:  .  ibuprofen (ADVIL) 200 MG tablet, Take 200 mg by mouth every 6 (six) hours as needed. , Disp: , Rfl:      Review of Systems  Per HPI unless specifically indicated above     Objective:    BP (!) 130/92   Pulse 78   Temp 97.7 F (36.5 C)   Ht 5' 7.75" (1.721 m)   Wt 247 lb (112 kg)   SpO2 97%   BMI 37.83 kg/m   Wt Readings from Last 3 Encounters:  02/11/20 247 lb (112 kg)  01/25/20 253 lb 8.5 oz (115 kg)  01/06/20 255 lb (115.7 kg)    Physical Exam Vitals reviewed.  Constitutional:      General: He is not in acute distress.    Appearance: Normal appearance. He is well-developed. He is obese. He is not ill-appearing.  HENT:     Head: Normocephalic and atraumatic.     Mouth/Throat:     Pharynx: No oropharyngeal exudate.  Eyes:     Conjunctiva/sclera: Conjunctivae normal.     Pupils: Pupils are equal, round, and reactive to light.  Neck:     Thyroid:  No thyromegaly.  Cardiovascular:     Rate and Rhythm: Normal rate and regular rhythm.  Pulmonary:     Effort: Pulmonary effort is normal.     Breath sounds: Normal breath sounds. No wheezing or rales.  Abdominal:     General: Bowel sounds are normal.     Palpations: Abdomen is soft. There is no mass.     Tenderness: There is no abdominal tenderness.  Musculoskeletal:     Right hand: No swelling or deformity. Normal range of motion.     Left hand: No swelling or deformity. Normal range of motion.     Cervical back: Neck supple.     Right knee: No swelling or deformity. Normal range of motion. No tenderness.     Left knee: No swelling or deformity. Normal range of motion. No tenderness.     Right lower leg: No edema.     Left lower leg: No edema.  Feet:     Comments: Mild tinea webspaces R foot.  No secondary infection.   Lymphadenopathy:     Cervical: No  cervical adenopathy.  Skin:    General: Skin is warm and dry.     Findings: No rash.  Neurological:     Mental Status: He is alert and oriented to person, place, and time.     Motor: No weakness or tremor.     Gait: Gait normal.  Psychiatric:        Attention and Perception: Attention normal.        Speech: Speech normal.        Behavior: Behavior normal. Behavior is cooperative.     Comments: Very pleasant and very engaged     Results for orders placed or performed during the hospital encounter of 02/04/20  Uric acid  Result Value Ref Range   Uric Acid, Serum 7.1 3.7 - 8.6 mg/dL  Sed Rate (ESR)  Result Value Ref Range   Sed Rate 20 (H) 0 - 16 mm/hr  C-reactive protein  Result Value Ref Range   CRP 0.7 <1.0 mg/dL  Rheumatoid Factor  Result Value Ref Range   Rhuematoid fact SerPl-aCnc <10.0 0.0 - 13.9 IU/mL  Lipid panel  Result Value Ref Range   Cholesterol 183 0 - 200 mg/dL   Triglycerides 196 (H) <150 mg/dL   HDL 46 >40 mg/dL   Total CHOL/HDL Ratio 4.0 RATIO   VLDL 39 0 - 40 mg/dL   LDL Cholesterol 98 0 - 99  mg/dL  Hemoglobin A1c  Result Value Ref Range   Hgb A1c MFr Bld 6.0 (H) 4.8 - 5.6 %   Mean Plasma Glucose 125.5 mg/dL  Comprehensive metabolic panel  Result Value Ref Range   Sodium 139 135 - 145 mmol/L   Potassium 4.5 3.5 - 5.1 mmol/L   Chloride 102 98 - 111 mmol/L   CO2 27 22 - 32 mmol/L   Glucose, Bld 94 70 - 99 mg/dL   BUN 16 6 - 20 mg/dL   Creatinine, Ser 1.19 0.61 - 1.24 mg/dL   Calcium 9.0 8.9 - 10.3 mg/dL   Total Protein 7.1 6.5 - 8.1 g/dL   Albumin 3.7 3.5 - 5.0 g/dL   AST 17 15 - 41 U/L   ALT 33 0 - 44 U/L   Alkaline Phosphatase 62 38 - 126 U/L   Total Bilirubin 0.5 0.3 - 1.2 mg/dL   GFR calc non Af Amer >60 >60 mL/min   GFR calc Af Amer >60 >60 mL/min   Anion gap 10 5 - 15      Assessment & Plan:    Encounter Diagnoses  Name Primary?  . Prediabetes Yes  . Tinea pedis, unspecified laterality   . Obesity, unspecified classification, unspecified obesity type, unspecified whether serious comorbidity present       -reviewed labs with pt -counseled on prediabetes and gave reading information -pt scheduled for influenza vaccination -pt has appt for second dose covid vaccination -pt counseled on foot care to help tinea -pt counseled on healthy weight management to help knees and prediabetes -encouraged use of foam or cushion when working on knees or knee pads -discussed no indication of systemic joint problem like RA at this time -pt to follow up in 1 year.  Will recheck a1c at that time.  He is to contact office sooner prn

## 2020-02-27 ENCOUNTER — Ambulatory Visit: Payer: Self-pay | Admitting: Physician Assistant

## 2020-10-27 ENCOUNTER — Encounter: Payer: Self-pay | Admitting: Physician Assistant

## 2021-02-10 ENCOUNTER — Ambulatory Visit: Payer: Self-pay | Admitting: Physician Assistant

## 2021-02-15 ENCOUNTER — Encounter: Payer: Self-pay | Admitting: Physician Assistant

## 2021-04-10 IMAGING — DX DG FOOT COMPLETE 3+V*L*
1 series · 1 of 1 positions shown · non-contrast
Comparison: None.

CLINICAL DATA: Lateral left foot pain for 1 week, no known injury

EXAM:
LEFT FOOT - COMPLETE 3+ VIEW

[foot lat]
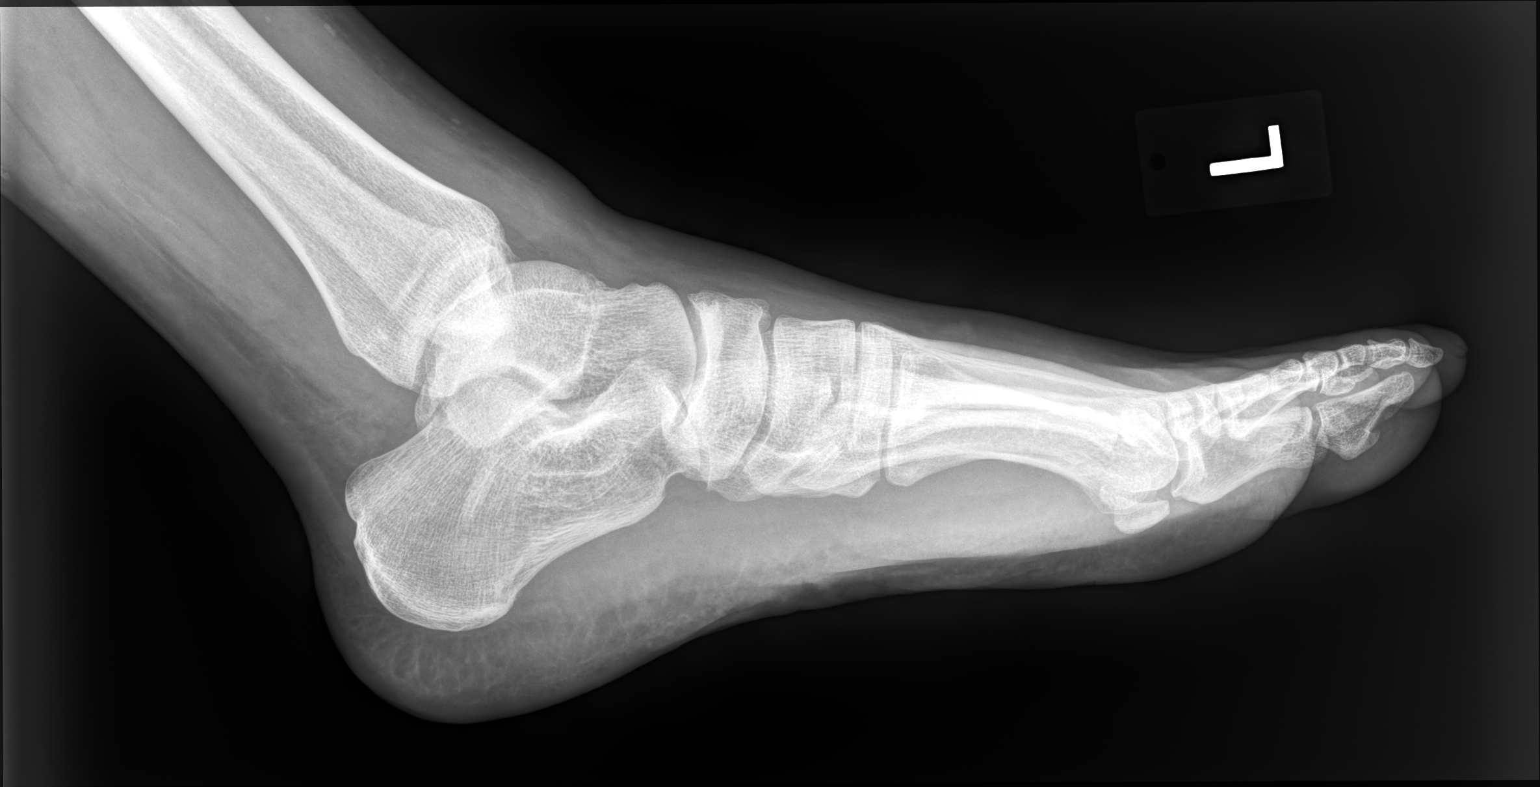

[1 of 1 positions shown; findings below may reference images not displayed]

FINDINGS: There is no evidence of fracture or dislocation. There is no
evidence of arthropathy or other focal bone abnormality. Soft
tissues are unremarkable.
IMPRESSION: No fracture or dislocation of the left foot. Joint spaces are
preserved.

## 2021-05-03 ENCOUNTER — Ambulatory Visit: Payer: Self-pay | Admitting: Physician Assistant

## 2021-06-02 ENCOUNTER — Other Ambulatory Visit: Payer: Self-pay

## 2021-06-02 ENCOUNTER — Ambulatory Visit: Payer: Self-pay | Admitting: Physician Assistant

## 2021-06-02 ENCOUNTER — Encounter: Payer: Self-pay | Admitting: Physician Assistant

## 2021-06-02 VITALS — BP 121/80 | HR 83 | Temp 97.0°F | Wt 241.0 lb

## 2021-06-02 DIAGNOSIS — Z23 Encounter for immunization: Secondary | ICD-10-CM

## 2021-06-02 DIAGNOSIS — R197 Diarrhea, unspecified: Secondary | ICD-10-CM

## 2021-06-02 DIAGNOSIS — E669 Obesity, unspecified: Secondary | ICD-10-CM

## 2021-06-02 DIAGNOSIS — R7303 Prediabetes: Secondary | ICD-10-CM

## 2021-06-02 DIAGNOSIS — Z0000 Encounter for general adult medical examination without abnormal findings: Secondary | ICD-10-CM

## 2021-06-02 DIAGNOSIS — Z8249 Family history of ischemic heart disease and other diseases of the circulatory system: Secondary | ICD-10-CM

## 2021-06-02 DIAGNOSIS — E781 Pure hyperglyceridemia: Secondary | ICD-10-CM

## 2021-06-02 NOTE — Progress Notes (Signed)
BP 121/80    Pulse 83    Temp (!) 97 F (36.1 C)    Wt 241 lb (109.3 kg)    SpO2 97%    BMI 36.91 kg/m    Subjective:    Patient ID: Juan Salazar, male    DOB: August 17, 1980, 40 y.o.   MRN: [id number]  HPI: Juan Salazar is a 40 y.o. male presenting on 06/02/2021 for Annual Exam   HPI   Chief Complaint  Patient presents with   Annual Exam     He works doing plumbing.  He says he is having diarrhea.  He thinks it is due to a tick bite 3 years ago and he thinks he is allergic to red meat and milk    He has diarrhea every day.  He has not tried cutting out the red meat or milk.   He has No abdominal pain except while having diarrhea.    His mother died at age 38 due to MI.  His father is alive at 50yo with a stent in his heart.   Pt is having no chest pains or sob.     Relevant past medical, surgical, family and social history reviewed and updated as indicated. Interim medical history since our last visit reviewed. Allergies and medications reviewed and updated.  No current outpatient medications on file.    Review of Systems  Per HPI unless specifically indicated above     Objective:    BP 121/80    Pulse 83    Temp (!) 97 F (36.1 C)    Wt 241 lb (109.3 kg)    SpO2 97%    BMI 36.91 kg/m   Wt Readings from Last 3 Encounters:  06/02/21 241 lb (109.3 kg)  02/11/20 247 lb (112 kg)  01/25/20 253 lb 8.5 oz (115 kg)    Physical Exam Vitals reviewed.  Constitutional:      General: He is not in acute distress.    Appearance: He is well-developed. He is obese. He is not ill-appearing.  HENT:     Head: Normocephalic and atraumatic.     Right Ear: Tympanic membrane, ear canal and external ear normal.     Left Ear: Tympanic membrane, ear canal and external ear normal.  Eyes:     Extraocular Movements: Extraocular movements intact.     Conjunctiva/sclera: Conjunctivae normal.     Pupils: Pupils are equal, round, and reactive to light.  Neck:     Thyroid: No  thyromegaly.  Cardiovascular:     Rate and Rhythm: Normal rate and regular rhythm.  Pulmonary:     Effort: Pulmonary effort is normal.     Breath sounds: Normal breath sounds. No wheezing or rales.  Abdominal:     General: Bowel sounds are normal.     Palpations: Abdomen is soft. There is no mass.     Tenderness: There is no abdominal tenderness.  Musculoskeletal:     Cervical back: Neck supple.     Right lower leg: No edema.     Left lower leg: No edema.  Lymphadenopathy:     Cervical: No cervical adenopathy.  Skin:    General: Skin is warm and dry.     Findings: No rash.  Neurological:     Mental Status: He is alert and oriented to person, place, and time.     Motor: No weakness or tremor.     Gait: Gait is intact.  Psychiatric:  Attention and Perception: Attention normal.        Speech: Speech normal.        Behavior: Behavior normal. Behavior is cooperative.            Assessment & Plan:   Encounter Diagnoses  Name Primary?   Encounter for annual health examination Yes   Hypertriglyceridemia    Prediabetes    Diarrhea, unspecified type    Obesity, unspecified classification, unspecified obesity type, unspecified whether serious comorbidity present    Family history of heart disease    Influenza vaccination administered at current visit       -will check labs.  He will be called with results -pt is given flu shot today -pt is encouraged to get covid booster -pt is encouraged to have trial of limiting dairy to see if his diarrhea improves.  He is given reading information.   -pt is encouraged to follow healthy diet and exercise regularly -pt to follow up 1 year.  He is to contact office sooner prn

## 2021-06-02 NOTE — Patient Instructions (Addendum)
Lactose Intolerance, Adult Lactose is a natural sugar that is found in dairy milk and dairy products such as cheese and yogurt. Lactose is digested by lactase, a protein in your small intestine. Some people do not produce enough lactase to digest lactose. This is called lactose intolerance. Lactose intolerance is different from milk allergy, which is a more serious reaction to the protein in milk. What are the causes? Causes of lactose intolerance may include: Normal aging. The ability to produce lactase may lessen with age, causing lactose intolerance over time. Being born without the ability to make lactase. Digestive diseases such as gastroenteritis or inflammatory bowel disease (IBD). Surgery or injury to your small intestine. Infection in your intestines. Certain antibiotic medicines and cancer treatments. What are the signs or symptoms? Lactose intolerance can cause discomfort within 30 minutes to 2 hours after you eat or drink something that contains lactose. Symptoms may include: Nausea. Diarrhea. Cramps or pain in the abdomen. Bloating. You may have a full, tight, or painful feeling in the abdomen. Gas. How is this diagnosed? This condition may be diagnosed based on: Your symptoms and medical history. Lactose tolerance test. This test involves drinking a lactose solution and then having blood tests to measure the amount of glucose in your blood. If your blood glucose level does not go up, it means your body is not able to digest the lactose. Lactose breath test (hydrogen breath test). This test involves drinking a lactose solution and then exhaling into a type of bag while you digest the solution. Having a lot of hydrogen in your breath can be a sign of lactose intolerance. How is this treated? There is no treatment to improve your body's ability to produce lactase. However, you can manage your symptoms at home by: Limiting or avoiding dairy milk, dairy products, and other sources of  lactose. Taking lactase tablets when you eat or drink milk products. Lactase tablets are over-the-counter medicines that help to improve lactose digestion. You may also add lactase drops to regular milk. Adjusting your diet, such as drinking lactose-free milk. Lactose tolerance varies from person to person. Some people may be able to eat or drink small amounts of products that contain lactose, and other people may need to avoid all foods and drinks that contain lactose. Talk with your health care provider about what treatment is best for you. Follow these instructions at home: Limit or avoid foods, beverages, and medicines that contain lactose, as told by your health care provider. Keep track of which foods, beverages, or medicines cause symptoms so you can decide what to avoid in the future. Read food and medicine labels carefully. Avoid products that contain: Lactose. Milk solids. Casein. Whey. Take over-the-counter and prescription medicines (including lactase tablets) only as told by your health care provider. If you stop eating and drinking dairy products, make sure to get enough protein, calcium, and vitamin D from other foods. Work with your health care provider or a dietitian to make sure you get enough of those nutrients. Choose a milk substitute that has been fortified with vitamin D or calcium. Fortified means that vitamin D or calcium has been added to the product. Soy milk contains high-quality protein. Milks that are made from nuts or grains contain very small amounts of protein. These include almond milk and rice milk. Keep all follow-up visits. This is important. Contact a health care provider if: You have no relief from your symptoms after you have eliminated milk products and other sources of lactose.  Get help right away if: You have blood in your stool (feces). You have severe abdominal pain. Summary Lactose is a natural sugar that is found in dairy milk and dairy products  such as cheese and yogurt. Lactose is digested by lactase, which is a protein in the small intestine. Some people do not produce enough lactase to digest lactose. This is called lactose intolerance. Lactose intolerance can cause discomfort within 30 minutes to 2 hours after you eat or drink something that contains lactose. Limit or avoid foods, beverages, and medicines that contain lactose, as told by your health care provider. This information is not intended to replace advice given to you by your health care provider. Make sure you discuss any questions you have with your health care provider. Document Revised: 05/12/2020 Document Reviewed: 05/12/2020 Elsevier Patient Education  [employer].    ------------------------------ Influenza (Flu) Vaccine (Inactivated or Recombinant): What You Need to Know 1. Why get vaccinated? Influenza vaccine can prevent influenza (flu). Flu is a contagious disease that spreads around the [place] every year, usually between October and May. Anyone can get the flu, but it is more dangerous for some people. Infants and young children, people 40 years and older, pregnant people, and people with certain health conditions or a weakened immune system are at greatest risk of flu complications. Pneumonia, bronchitis, sinus infections, and ear infections are examples of flu-related complications. If you have a medical condition, such as heart disease, cancer, or diabetes, flu can make it worse. Flu can cause fever and chills, sore throat, muscle aches, fatigue, cough, headache, and runny or stuffy nose. Some people may have vomiting and diarrhea, though this is more common in children than adults. In an average year, thousands of people in the [place] die from flu, and many more are hospitalized. Flu vaccine prevents millions of illnesses and flu-related visits to the doctor each year. 2. Influenza vaccines CDC recommends everyone 6 months and older get  vaccinated every flu season. Children 6 months through 60 years of age may need 2 doses during a single flu season. Everyone else needs only 1 dose each flu season. It takes about 2 weeks for protection to develop after vaccination. There are many flu viruses, and they are always changing. Each year a new flu vaccine is made to protect against the influenza viruses believed to be likely to cause disease in the upcoming flu season. Even when the vaccine doesn't exactly match these viruses, it may still provide some protection. Influenza vaccine does not cause flu. Influenza vaccine may be given at the same time as other vaccines. 3. Talk with your health care provider Tell your vaccination provider if the person getting the vaccine: Has had an allergic reaction after a previous dose of influenza vaccine, or has any severe, life-threatening allergies Has ever had Guillain-Barr Syndrome (also called "GBS") In some cases, your health care provider may decide to postpone influenza vaccination until a future visit. Influenza vaccine can be administered at any time during pregnancy. People who are or will be pregnant during influenza season should receive inactivated influenza vaccine. People with minor illnesses, such as a cold, may be vaccinated. People who are moderately or severely ill should usually wait until they recover before getting influenza vaccine. Your health care provider can give you more information. 4. Risks of a vaccine reaction Soreness, redness, and swelling where the shot is given, fever, muscle aches, and headache can happen after influenza vaccination. There may be a  very small increased risk of Guillain-Barr Syndrome (GBS) after inactivated influenza vaccine (the flu shot). Young children who get the flu shot along with pneumococcal vaccine (PCV13) and/or DTaP vaccine at the same time might be slightly more likely to have a seizure caused by fever. Tell your health care provider if  a child who is getting flu vaccine has ever had a seizure. People sometimes faint after medical procedures, including vaccination. Tell your provider if you feel dizzy or have vision changes or ringing in the ears. As with any medicine, there is a very remote chance of a vaccine causing a severe allergic reaction, other serious injury, or death. 5. What if there is a serious problem? An allergic reaction could occur after the vaccinated person leaves the clinic. If you see signs of a severe allergic reaction (hives, swelling of the face and throat, difficulty breathing, a fast heartbeat, dizziness, or weakness), call 9-1-1 and get the person to the nearest hospital. For other signs that concern you, call your health care provider. Adverse reactions should be reported to the Vaccine Adverse Event Reporting System (VAERS). Your health care provider will usually file this report, or you can do it yourself. Visit the VAERS website at www.vaers.SamedayNews.es or call (319) 072-7800. VAERS is only for reporting reactions, and VAERS staff members do not give medical advice. 6. The National Vaccine Injury Compensation Program The [employer] Vaccine Injury Compensation Program (VICP) is a federal program that was created to compensate people who may have been injured by certain vaccines. Claims regarding alleged injury or death due to vaccination have a time limit for filing, which may be as short as two years. Visit the VICP website at GoldCloset.com.ee or call (470) 332-2995 to learn about the program and about filing a claim. 7. How can I learn more? Ask your health care provider. Call your local or state health department. Visit the website of the Food and Drug Administration (FDA) for vaccine package inserts and additional information at TraderRating.uy. Contact the Centers for Disease Control and Prevention (CDC): Call 859 540 0266 (1-800-CDC-INFO) or Visit CDC's  website at https://gibson.com/. Vaccine Information Statement Inactivated Influenza Vaccine (01/24/2020) This information is not intended to replace advice given to you by your health care provider. Make sure you discuss any questions you have with your health care provider. Document Revised: 02/25/2021 Document Reviewed: 02/25/2021 Elsevier Patient Education  2022 [employer].

## 2021-06-24 ENCOUNTER — Other Ambulatory Visit: Payer: Self-pay

## 2021-06-24 ENCOUNTER — Other Ambulatory Visit (HOSPITAL_COMMUNITY)
Admission: RE | Admit: 2021-06-24 | Discharge: 2021-06-24 | Disposition: A | Discharge status: Home / Self Care | Payer: Self-pay | Source: Ambulatory Visit | Attending: Physician Assistant | Admitting: Physician Assistant

## 2021-06-24 DIAGNOSIS — R7303 Prediabetes: Secondary | ICD-10-CM | POA: Insufficient documentation

## 2021-06-24 DIAGNOSIS — E781 Pure hyperglyceridemia: Secondary | ICD-10-CM | POA: Insufficient documentation

## 2021-06-24 DIAGNOSIS — R197 Diarrhea, unspecified: Secondary | ICD-10-CM | POA: Insufficient documentation

## 2021-06-24 LAB — COMPREHENSIVE METABOLIC PANEL
ALT: 15 U/L (ref 0–44)
AST: 16 U/L (ref 15–41)
Albumin: 3.6 g/dL (ref 3.5–5.0)
Alkaline Phosphatase: 70 U/L (ref 38–126)
Anion gap: 6 (ref 5–15)
BUN: 10 mg/dL (ref 6–20)
CO2: 26 mmol/L (ref 22–32)
Calcium: 8.7 mg/dL — ABNORMAL LOW (ref 8.9–10.3)
Chloride: 105 mmol/L (ref 98–111)
Creatinine, Ser: 1.16 mg/dL (ref 0.61–1.24)
GFR, Estimated: >60 mL/min (ref >60)
Glucose, Bld: 91 mg/dL (ref 70–99)
Potassium: 4.2 mmol/L (ref 3.5–5.1)
Sodium: 137 mmol/L (ref 135–145)
Total Bilirubin: 0.5 mg/dL (ref 0.3–1.2)
Total Protein: 7.3 g/dL (ref 6.5–8.1)

## 2021-06-24 LAB — LIPID PANEL
Cholesterol: 145 mg/dL (ref 0–200)
HDL: 31 mg/dL — ABNORMAL LOW (ref >40)
LDL Cholesterol: 90 mg/dL (ref 0–99)
Total CHOL/HDL Ratio: 4.7 RATIO
Triglycerides: 118 mg/dL (ref <150)
VLDL: 24 mg/dL (ref 0–40)

## 2021-06-24 LAB — HEMOGLOBIN A1C
Hgb A1c MFr Bld: 5.4 % (ref 4.8–5.6)
Mean Plasma Glucose: 108.28 mg/dL

## 2021-08-27 ENCOUNTER — Other Ambulatory Visit: Payer: Self-pay

## 2021-08-27 ENCOUNTER — Ambulatory Visit
Admission: RE | Admit: 2021-08-27 | Discharge: 2021-08-27 | Disposition: A | Discharge status: Home / Self Care | Payer: Self-pay | Source: Ambulatory Visit | Attending: Family Medicine | Admitting: Family Medicine

## 2021-08-27 DIAGNOSIS — R197 Diarrhea, unspecified: Secondary | ICD-10-CM

## 2021-08-27 DIAGNOSIS — R509 Fever, unspecified: Secondary | ICD-10-CM

## 2021-08-27 DIAGNOSIS — R112 Nausea with vomiting, unspecified: Secondary | ICD-10-CM

## 2021-08-27 LAB — POCT RAPID STREP A (OFFICE): Rapid Strep A Screen: NEGATIVE

## 2021-08-27 MED ORDER — ONDANSETRON 4 MG PO TBDP
4.0000 mg | ORAL_TABLET | Freq: Once | ORAL | Status: AC
  Administered 2021-08-27: 4 mg via ORAL

## 2021-08-27 MED ORDER — ONDANSETRON 8 MG PO TBDP: 8.0000 mg | ORAL_TABLET | Freq: Three times a day (TID) | ORAL | 0 refills | Status: DC | PRN

## 2021-08-27 MED ORDER — SODIUM CHLORIDE 0.9 % IV BOLUS
1000.0000 mL | Freq: Once | INTRAVENOUS | Status: AC
  Administered 2021-08-27: 1000 mL via INTRAVENOUS

## 2021-08-27 NOTE — ED Provider Notes (Signed)
?[facility] ? ? ? ?CSN: [id number] ?Arrival date & time: 08/27/21  1456 ? ? ?  ? ?History   ?Chief Complaint ?Chief Complaint  ?Patient presents with  ? Fever  ? Diarrhea  ? Emesis  ? ?HPI ?Juan Salazar is a 41 y.o. male.  ? ?Presenting today with 3-day history of severe nausea, vomiting, diarrhea, fever, chills, body aches, weakness, fatigue.  Denies cough, congestion, sore throat, chest pain, shortness of breath.  Not tolerating anything by mouth at this time.  Taking Tylenol with minimal relief and vomiting any medications up.  Multiple sick contacts but unsure with what.  No known pertinent chronic medical problems. ? ?Past Medical History:  ?Diagnosis Date  ? Psoriasis   ? ?There are no problems to display for this patient. ? ? ?History reviewed. No pertinent surgical history. ? ? ? ? ?Home Medications   ? ?Prior to Admission medications   ?Medication Sig Start Date End Date Taking? Authorizing Provider  ?ondansetron (ZOFRAN-ODT) 8 MG disintegrating tablet Take 1 tablet (8 mg total) by mouth every 8 (eight) hours as needed for nausea or vomiting. 08/27/21  Yes Particia Nearing, PA-C  ? ? ?Family History ?Family History  ?Problem Relation Age of Onset  ? Heart disease Father   ? ? ?Social History ?Social History  ? ?Tobacco Use  ? Smoking status: Never  ? Smokeless tobacco: Never  ?Substance Use Topics  ? Alcohol use: Not Currently  ?  Comment: rarely  ? Drug use: No  ? ? ? ?Allergies   ?Patient has no known allergies. ? ? ?Review of Systems ?Review of Systems ?Per HPI ? ?Physical Exam ?Triage Vital Signs ?ED Triage Vitals  ?Enc Vitals Group  ?   BP 08/27/21 1512 114/79  ?   Pulse Rate 08/27/21 1512 (!) 106  ?   Resp 08/27/21 1512 20  ?   Temp 08/27/21 1512 (!) 100.9 ?F (38.3 ?C)  ?   Temp src --   ?   SpO2 08/27/21 1512 98 %  ?   Weight --   ?   Height --   ?   Head Circumference --   ?   Peak Flow --   ?   Pain Score 08/27/21 1511 0  ?   Pain Loc --   ?   Pain Edu? --   ?   Excl. in GC? --    ? ?No data found. ? ?Updated Vital Signs ?BP 114/79   Pulse (!) 106   Temp (!) 100.9 ?F (38.3 ?C)   Resp 20   SpO2 98%  ? ?Visual Acuity ?Right Eye Distance:   ?Left Eye Distance:   ?Bilateral Distance:   ? ?Right Eye Near:   ?Left Eye Near:    ?Bilateral Near:    ? ?Physical Exam ?Vitals and nursing note reviewed.  ?Constitutional:   ?   Comments: Appears mildly lethargic  ?HENT:  ?   Head: Atraumatic.  ?   Mouth/Throat:  ?   Mouth: Mucous membranes are dry.  ?Eyes:  ?   Extraocular Movements: Extraocular movements intact.  ?   Conjunctiva/sclera: Conjunctivae normal.  ?Cardiovascular:  ?   Rate and Rhythm: Normal rate and regular rhythm.  ?Pulmonary:  ?   Effort: Pulmonary effort is normal.  ?   Breath sounds: Normal breath sounds. No wheezing or rales.  ?Abdominal:  ?   General: Bowel sounds are normal. There is no distension.  ?   Palpations:  Abdomen is soft.  ?   Tenderness: There is abdominal tenderness. There is no right CVA tenderness, left CVA tenderness or guarding.  ?   Comments: Mild generalized tenderness to palpation diffusely without distention or guarding  ?Musculoskeletal:     ?   General: Normal range of motion.  ?   Cervical back: Normal range of motion and neck supple.  ?Skin: ?   General: Skin is warm and dry.  ?Neurological:  ?   General: No focal deficit present.  ?   Mental Status: He is oriented to person, place, and time.  ?Psychiatric:     ?   Mood and Affect: Mood normal.     ?   Thought Content: Thought content normal.     ?   Judgment: Judgment normal.  ? ?UC Treatments / Results  ?Labs ?(all labs ordered are listed, but only abnormal results are displayed) ?Labs Reviewed  ?CULTURE, GROUP A STREP [facility])  ?COVID-19, FLU A+B NAA  ?POCT RAPID STREP A (OFFICE)  ? ? ?EKG ? ? ?Radiology ?No results found. ? ?Procedures ?Procedures (including critical care time) ? ?Medications Ordered in UC ?Medications  ?ondansetron (ZOFRAN-ODT) disintegrating tablet 4 mg (4 mg Oral Given 08/27/21 1530)   ?sodium chloride 0.9 % bolus 1,000 mL (0 mLs Intravenous Stopped 08/27/21 1633)  ? ?Initial Impression / Assessment and Plan / UC Course  ?I have reviewed the triage vital signs and the nursing notes. ? ?Pertinent labs & imaging results that were available during my care of the patient were reviewed by me and considered in my medical decision making (see chart for details). ? ? Febrile and tachycardic in triage, otherwise vital signs reassuring.  Zofran administered and he is noted after about 30 minutes to be tolerating p.o. coconut water without difficulty.  IV fluids administered and tolerated well with mild to moderate benefit additionally.  We will send Zofran for as needed use additionally, discussed brat diet, push fluids.  Work note given.  Return for acutely worsening symptoms ? ?Final Clinical Impressions(s) / UC Diagnoses  ? ?Final diagnoses:  ?Nausea and vomiting, unspecified vomiting type  ?Fever, unspecified  ?Diarrhea, unspecified type  ? ?Discharge Instructions   ?None ?  ? ?ED Prescriptions   ? ? Medication Sig Dispense Auth. Provider  ? ondansetron (ZOFRAN-ODT) 8 MG disintegrating tablet Take 1 tablet (8 mg total) by mouth every 8 (eight) hours as needed for nausea or vomiting. 20 tablet Particia Nearing, [place]  ? ?  ? ?PDMP not reviewed this encounter. ?  ?Particia Nearing, PA-C ?08/27/21 1650 ? ?

## 2021-08-27 NOTE — ED Triage Notes (Signed)
Pt presents with c/o vomiting and diarrhea for past 3 days  ?

## 2021-08-28 ENCOUNTER — Emergency Department (HOSPITAL_COMMUNITY)
Admission: EM | Admit: 2021-08-28 | Discharge: 2021-08-29 | Disposition: A | Discharge status: Home / Self Care | Payer: Self-pay | Attending: Emergency Medicine | Admitting: Emergency Medicine

## 2021-08-28 ENCOUNTER — Encounter (HOSPITAL_COMMUNITY): Payer: Self-pay

## 2021-08-28 ENCOUNTER — Other Ambulatory Visit: Payer: Self-pay

## 2021-08-28 DIAGNOSIS — N179 Acute kidney failure, unspecified: Secondary | ICD-10-CM

## 2021-08-28 DIAGNOSIS — U071 COVID-19: Secondary | ICD-10-CM | POA: Insufficient documentation

## 2021-08-28 LAB — COMPREHENSIVE METABOLIC PANEL
ALT: 31 U/L (ref 0–44)
AST: 44 U/L — ABNORMAL HIGH (ref 15–41)
Albumin: 3.7 g/dL (ref 3.5–5.0)
Alkaline Phosphatase: 90 U/L (ref 38–126)
Anion gap: 13 (ref 5–15)
BUN: 12 mg/dL (ref 6–20)
CO2: 20 mmol/L — ABNORMAL LOW (ref 22–32)
Calcium: 8.5 mg/dL — ABNORMAL LOW (ref 8.9–10.3)
Chloride: 98 mmol/L (ref 98–111)
Creatinine, Ser: 1.64 mg/dL — ABNORMAL HIGH (ref 0.61–1.24)
GFR, Estimated: 55 mL/min — ABNORMAL LOW (ref >60)
Glucose, Bld: 105 mg/dL — ABNORMAL HIGH (ref 70–99)
Potassium: 4.3 mmol/L (ref 3.5–5.1)
Sodium: 131 mmol/L — ABNORMAL LOW (ref 135–145)
Total Bilirubin: 0.8 mg/dL (ref 0.3–1.2)
Total Protein: 7.7 g/dL (ref 6.5–8.1)

## 2021-08-28 LAB — CBC WITH DIFFERENTIAL/PLATELET
Abs Immature Granulocytes: 0.06 10*3/uL (ref 0.00–0.07)
Basophils Absolute: 0 10*3/uL (ref 0.0–0.1)
Basophils Relative: 0 %
Eosinophils Absolute: 0 10*3/uL (ref 0.0–0.5)
Eosinophils Relative: 0 %
HCT: 44.2 % (ref 39.0–52.0)
Hemoglobin: 13.7 g/dL (ref 13.0–17.0)
Immature Granulocytes: 1 %
Lymphocytes Relative: 24 %
Lymphs Abs: 2.6 10*3/uL (ref 0.7–4.0)
MCH: 26.2 pg (ref 26.0–34.0)
MCHC: 31 g/dL (ref 30.0–36.0)
MCV: 84.5 fL (ref 80.0–100.0)
Monocytes Absolute: 0.6 10*3/uL (ref 0.1–1.0)
Monocytes Relative: 6 %
Neutro Abs: 7.7 10*3/uL (ref 1.7–7.7)
Neutrophils Relative %: 69 %
Platelets: 258 10*3/uL (ref 150–400)
RBC: 5.23 MIL/uL (ref 4.22–5.81)
RDW: 16.3 % — ABNORMAL HIGH (ref 11.5–15.5)
WBC: 11.1 10*3/uL — ABNORMAL HIGH (ref 4.0–10.5)
nRBC: 0 % (ref 0.0–0.2)

## 2021-08-28 LAB — RESP PANEL BY RT-PCR (FLU A&B, COVID) ARPGX2
Influenza A by PCR: NEGATIVE
Influenza B by PCR: NEGATIVE
SARS Coronavirus 2 by RT PCR: POSITIVE — AB

## 2021-08-28 MED ORDER — LACTATED RINGERS IV BOLUS
1000.0000 mL | Freq: Once | INTRAVENOUS | Status: AC
  Administered 2021-08-28: 1000 mL via INTRAVENOUS

## 2021-08-28 MED ORDER — LACTATED RINGERS IV BOLUS
1000.0000 mL | Freq: Once | INTRAVENOUS | Status: AC
  Administered 2021-08-29: 1000 mL via INTRAVENOUS

## 2021-08-28 MED ORDER — ACETAMINOPHEN 500 MG PO TABS
1000.0000 mg | ORAL_TABLET | Freq: Once | ORAL | Status: AC
  Administered 2021-08-28: 1000 mg via ORAL
  Filled 2021-08-28: qty 2

## 2021-08-28 MED ORDER — ONDANSETRON HCL 4 MG/2ML IJ SOLN
4.0000 mg | Freq: Once | INTRAMUSCULAR | Status: AC
  Administered 2021-08-29: 4 mg via INTRAVENOUS
  Filled 2021-08-28: qty 2

## 2021-08-28 NOTE — ED Provider Notes (Signed)
?[facility] ?Provider Note ? ? ?CSN: [id number] ?Arrival date & time: 08/28/21  1932 ? ?  ? ?History ? ?Chief Complaint  ?Patient presents with  ? Fever  ? ? ?Juan Salazar is a 41 y.o. male with a past medical history of psoriasis.  Presents the emergency department with a chief complaint of flulike illness.  Patient reports that his symptoms started on Wednesday.  He endorses that he has been having fever, chills, nausea, vomiting, diarrhea, productive cough, and sore throat.  Patient states that yesterday he went to urgent care for evaluation due to recurrent nausea and vomiting and being unable to hold any fluids down.  Patient reports that he has nausea and vomiting has improved after being prescribed Zofran, has been able to tolerate p.o. liquids today however states that he has noticed a decreased urinary output.  Patient reports that he is having bouts of diarrhea 1-2 times an hour. ? ?Patient has been vaccinated for COVID-19 and influenza.  Has not received any COVID-19 boosters.  Patient reports that his daughter was sick with fevers recently.  Denies any recent camping, international travel, or antibiotic use.  Patient reports taking ibuprofen at 1800 and Tylenol at 1400. ? ?Denies any facial swelling, drooling, trouble swallowing, hot potato voice, trismus, shortness of breath, chest pain, abdominal pain, constipation, blood in stool, melena, dysuria, hematuria, urinary urgency, swelling or tenderness to genitals, lightheadedness, syncope. ? ? ?Fever ?Associated symptoms: cough, diarrhea, nausea, sore throat and vomiting   ?Associated symptoms: no chest pain, no chills, no confusion, no dysuria, no headaches and no rash   ? ?  ? ?Home Medications ?Prior to Admission medications   ?Medication Sig Start Date End Date Taking? Authorizing Provider  ?ondansetron (ZOFRAN-ODT) 8 MG disintegrating tablet Take 1 tablet (8 mg total) by mouth every 8 (eight) hours as needed  for nausea or vomiting. 08/27/21   Volney American, PA-C  ?   ? ?Allergies    ?Patient has no known allergies.   ? ?Review of Systems   ?Review of Systems  ?Constitutional:  Positive for fever. Negative for chills.  ?HENT:  Positive for sore throat.   ?Eyes:  Negative for visual disturbance.  ?Respiratory:  Positive for cough. Negative for shortness of breath.   ?Cardiovascular:  Negative for chest pain.  ?Gastrointestinal:  Positive for diarrhea, nausea and vomiting. Negative for abdominal distention, abdominal pain, anal bleeding, blood in stool, constipation and rectal pain.  ?Genitourinary:  Positive for decreased urine volume. Negative for difficulty urinating, dysuria, frequency, hematuria, penile swelling, scrotal swelling, testicular pain and urgency.  ?Musculoskeletal:  Negative for back pain and neck pain.  ?Skin:  Negative for color change and rash.  ?Neurological:  Negative for dizziness, syncope, light-headedness and headaches.  ?Psychiatric/Behavioral:  Negative for confusion.   ? ?Physical Exam ?Updated Vital Signs ?BP 119/69 (BP Location: Right Arm)   Pulse 93   Temp 99.2 ?F (37.3 ?C) (Oral)   Resp 18   Ht 5\' 7"  (1.702 m)   Wt 108.9 kg   SpO2 100%   BMI 37.59 kg/m?  ?Physical Exam ?Vitals and nursing note reviewed.  ?Constitutional:   ?   General: He is not in acute distress. ?   Appearance: He is ill-appearing. He is not toxic-appearing or diaphoretic.  ?HENT:  ?   Head: Normocephalic.  ?Eyes:  ?   General: No scleral icterus.    ?   Right eye: No discharge.     ?  Left eye: No discharge.  ?Cardiovascular:  ?   Rate and Rhythm: Normal rate.  ?Pulmonary:  ?   Effort: Pulmonary effort is normal. No tachypnea, bradypnea or respiratory distress.  ?   Breath sounds: Normal breath sounds. No stridor.  ?   Comments: Speaks in full complete sentences without difficulty ?Abdominal:  ?   General: Abdomen is flat. Bowel sounds are normal. There is no distension. There are no signs of injury.  ?    Palpations: Abdomen is soft. There is no mass or pulsatile mass.  ?   Tenderness: There is no abdominal tenderness. There is no guarding or rebound.  ?   Hernia: There is no hernia in the umbilical area or ventral area.  ?Skin: ?   General: Skin is warm and dry.  ?Neurological:  ?   General: No focal deficit present.  ?   Mental Status: He is alert.  ?Psychiatric:     ?   Behavior: Behavior is cooperative.  ? ? ?ED Results / Procedures / Treatments   ?Labs ?(all labs ordered are listed, but only abnormal results are displayed) ?Labs Reviewed  ?RESP PANEL BY RT-PCR (FLU A&B, COVID) ARPGX2 - Abnormal; Notable for the following components:  ?    Result Value  ? SARS Coronavirus 2 by RT PCR POSITIVE (*)   ? All other components within normal limits  ?CBC WITH DIFFERENTIAL/PLATELET - Abnormal; Notable for the following components:  ? WBC 11.1 (*)   ? RDW 16.3 (*)   ? All other components within normal limits  ?COMPREHENSIVE METABOLIC PANEL - Abnormal; Notable for the following components:  ? Sodium 131 (*)   ? CO2 20 (*)   ? Glucose, Bld 105 (*)   ? Creatinine, Ser 1.64 (*)   ? Calcium 8.5 (*)   ? AST 44 (*)   ? GFR, Estimated 55 (*)   ? All other components within normal limits  ? ? ?EKG ?None ? ?Radiology ?No results found. ? ?Procedures ?Procedures  ? ? ?Medications Ordered in ED ?Medications  ?lactated ringers bolus 1,000 mL (has no administration in time range)  ?acetaminophen (TYLENOL) tablet 1,000 mg (1,000 mg Oral Given 08/28/21 2154)  ?lactated ringers bolus 1,000 mL (1,000 mLs Intravenous New Bag/Given 08/28/21 2308)  ?ondansetron [facility]) injection 4 mg (4 mg Intravenous Given 08/29/21 0004)  ? ? ?ED Course/ Medical Decision Making/ A&P ?  ?                        ?Medical Decision Making ?Amount and/or Complexity of Data Reviewed ?Labs: ordered. ? ?Risk ?OTC drugs. ?Prescription drug management. ? ? ?Alert 41 year old male in no acute distress, nontoxic-appearing.  Patient does appear ill.  Presents to the  emergency department with a chief complaint of flulike illness. ? ?Information obtained from patient patient's wife at bedside.  Past medical records reviewed including previous provider notes and labs.   ? ?Per chart review patient was seen at [facility] urgent care on 3/10.  Patient tested negative for strep PCR.  COVID-19 and influenza testing pending.  Patient was given Zofran and able to tolerate p.o. intake.  Received 1 L normal saline fluid bolus. ? ?While in the room with patient blood pressure with systolic in the low [id number].  With report of decreased urinary output in the setting of persistent vomiting and diarrhea we will check basic lab work and give patient fluid bolus. ? ?Lab work and fluids were  delayed due to difficulty establishing IV access. ? ?Labs were independently viewed and interpreted by myself.  Pertinent findings include: ?-COVID-19 positive ?-Leukocytosis at 11.1 ?-Creatinine 1.64 ?-Sodium 131 ? ?Patient was given Zofran due to reports of nausea.  Patient was given Tylenol due to low-grade fever. ? ?Due to patient's increased creatinine will give additional second fluid bolus.  Plan to recheck BMP. ? ?Patient care transferred to [facility] at the end of my shift. Patient presentation, ED course, and plan of care discussed with review of all pertinent labs and imaging. Please see his/her note for further details regarding further ED course and disposition.  ? ? ? ? ? ? ? ? ?Final Clinical Impression(s) / ED Diagnoses ?Final diagnoses:  ?None  ? ? ?Rx / DC Orders ?ED Discharge Orders   ? ? None  ? ?  ? ? ?  ?Loni Beckwith, PA-C ?08/29/21 0154 ? ?  ?Davonna Belling, MD ?08/29/21 1106 ? ?

## 2021-08-28 NOTE — ED Triage Notes (Signed)
Reports fevers up to 103, cough, headache, n/v/d x 4 days. Seen at [facility] yesterday. Taking prn zofran odt at home.  ?

## 2021-08-29 LAB — COVID-19, FLU A+B NAA
Influenza A, NAA: NOT DETECTED
Influenza B, NAA: NOT DETECTED
SARS-CoV-2, NAA: NOT DETECTED

## 2021-08-29 LAB — BASIC METABOLIC PANEL
Anion gap: 9 (ref 5–15)
BUN: 12 mg/dL (ref 6–20)
CO2: 23 mmol/L (ref 22–32)
Calcium: 8.2 mg/dL — ABNORMAL LOW (ref 8.9–10.3)
Chloride: 98 mmol/L (ref 98–111)
Creatinine, Ser: 1.49 mg/dL — ABNORMAL HIGH (ref 0.61–1.24)
GFR, Estimated: >60 mL/min (ref >60)
Glucose, Bld: 113 mg/dL — ABNORMAL HIGH (ref 70–99)
Potassium: 4.3 mmol/L (ref 3.5–5.1)
Sodium: 130 mmol/L — ABNORMAL LOW (ref 135–145)

## 2021-08-29 MED ORDER — BENZONATATE 100 MG PO CAPS: 100.0000 mg | ORAL_CAPSULE | Freq: Three times a day (TID) | ORAL | 0 refills | Status: DC | PRN

## 2021-08-29 MED ORDER — ACETAMINOPHEN 325 MG PO TABS
650.0000 mg | ORAL_TABLET | Freq: Four times a day (QID) | ORAL | Status: DC | PRN
  Administered 2021-08-29: 650 mg via ORAL
  Filled 2021-08-29: qty 2

## 2021-08-29 NOTE — Discharge Instructions (Addendum)
You came to the emergency department today with reports of Covid-19 like symptoms.   You tested positive for COVID-19. Please isolate at home for at least 5 days after the day your symptoms initially began, and THEN at least 24 hours after you are fever-free without the help of medications (Tylenol/acetaminophen and Advil/ibuprofen/Motrin) AND your symptoms are improving.  You can alternate Tylenol/acetaminophen and Advil/ibuprofen/Motrin every 4 hours for sore throat, body aches, headache or fever.  Drink plenty of water.  Use saline nasal spray for congestion. You can take Tessalon every 8 hours as needed for cough. Wash your hands frequently. Please rest as needed with frequent repositioning and ambulation as tolerated.    If you use a CPAP or BiPAP device for management of obstructive sleep apnea may continue to use it however use it when isolated from other individuals to avoid spread of COVID-19.   If you use a nebulizer administer medication such as albuterol you may continue to use it however only one isolated from other individuals to avoid the spread of COVID-19.  If your symptoms do not improve please follow-up with your primary care provider or urgent care.  Return to the ER for significant shortness of breath, uncontrollable vomiting, severe chest pain, inability to tolerate fluids, changes in mental status such as confusion or other concerning symptoms.  

## 2021-08-30 LAB — CULTURE, GROUP A STREP (THRC)

## 2022-05-24 ENCOUNTER — Ambulatory Visit: Payer: Self-pay | Admitting: Physician Assistant

## 2022-05-24 ENCOUNTER — Encounter: Payer: Self-pay | Admitting: Physician Assistant

## 2022-05-24 VITALS — BP 123/78 | HR 57 | Temp 98.0°F | Wt 241.0 lb

## 2022-05-24 DIAGNOSIS — R7989 Other specified abnormal findings of blood chemistry: Secondary | ICD-10-CM

## 2022-05-24 DIAGNOSIS — R197 Diarrhea, unspecified: Secondary | ICD-10-CM

## 2022-05-24 DIAGNOSIS — Z0000 Encounter for general adult medical examination without abnormal findings: Secondary | ICD-10-CM

## 2022-05-24 NOTE — Progress Notes (Signed)
BP 123/78   Pulse (!) 57   Temp 98 F (36.7 C)   Wt 241 lb (109.3 kg)   SpO2 97%   BMI 37.75 kg/m    Subjective:    Patient ID: Juan Salazar, male    DOB: 1980-10-09, 41 y.o.   MRN: [id number]  HPI: Juan Salazar is a 41 y.o. male presenting on 05/24/2022 for Annual Exam   HPI   Chief Complaint  Patient presents with   Annual Exam      He works as Nutritional therapist at [employer].  He says he is doing well mostly.    He has diarrhea.  He avoids milk but doesn't know that it helps.   He has diarrhea daily.  He hasn't seen a regular stool in a long time.  He has diarrhea 3 or 4 times/day.     He has No abdominal pain except as a mild signal that he needs to go to the bathroom.  This has been going on 2 or 3 years.  He Never has a normal BM.   He Sometimes has a bit of nausea but no emesis.    He has No anxiety or depression.     Relevant past medical, surgical, family and social history reviewed and updated as indicated. Interim medical history since our last visit reviewed. Allergies and medications reviewed and updated.   No current outpatient medications on file.    Review of Systems  Per HPI unless specifically indicated above     Objective:    BP 123/78   Pulse (!) 57   Temp 98 F (36.7 C)   Wt 241 lb (109.3 kg)   SpO2 97%   BMI 37.75 kg/m   Wt Readings from Last 3 Encounters:  05/24/22 241 lb (109.3 kg)  08/28/21 240 lb (108.9 kg)  06/02/21 241 lb (109.3 kg)    Physical Exam Vitals reviewed.  Constitutional:      General: He is not in acute distress.    Appearance: He is well-developed. He is not ill-appearing.  HENT:     Head: Normocephalic and atraumatic.     Right Ear: Tympanic membrane, ear canal and external ear normal.     Left Ear: Tympanic membrane normal.  Cardiovascular:     Rate and Rhythm: Normal rate and regular rhythm.  Pulmonary:     Effort: Pulmonary effort is normal.     Breath sounds: Normal breath sounds. No wheezing.   Abdominal:     General: Bowel sounds are normal.     Palpations: Abdomen is soft. There is no hepatomegaly, splenomegaly or mass.     Tenderness: There is no abdominal tenderness. There is no guarding or rebound.  Musculoskeletal:     Cervical back: Neck supple.     Right lower leg: No edema.     Left lower leg: No edema.  Lymphadenopathy:     Cervical: No cervical adenopathy.  Skin:    General: Skin is warm and dry.     Comments: A few scattered dry plaques (left elbow, above eyebrows)  Neurological:     Mental Status: He is alert and oriented to person, place, and time.     Motor: No weakness or tremor.     Gait: Gait is intact. Gait normal.     Deep Tendon Reflexes:     Reflex Scores:      Patellar reflexes are 2+ on the right side and 2+ on the left side. Psychiatric:  Attention and Perception: Attention normal.        Mood and Affect: Mood normal.        Speech: Speech normal.        Behavior: Behavior normal. Behavior is cooperative.            Assessment & Plan:   Encounter Diagnoses  Name Primary?   Encounter for annual health examination Yes   Diarrhea, unspecified type    Elevated serum creatinine       -He has applied for medicaid and is waiting to see if he gets approved.  -He applied about 2 1/2 weeks ago.   -Refer to GI if gets medicaid denial.  If he gets approved, he will need referral from his new PCP so medicaid will pay for it -recommended Aquaphor for skin dryness.  He is to let us know when he feels like he wants a topical steroid -will recheck bmp today in light of elevated Cr earlier this year (when he had covid -pt was encouraged to eat heart healthy, lowfat diet -pt will need to update enrollment with [employer] if he doesn't get approve for medicaid (enrollment expired in June this year) -pt is scheduled to return 1 year for Annual f/u.  He is to contact office sooner 1- to let us know about insurance status and get referral and 2-  if he has any issues that arise.  He is in agreement

## 2022-05-24 NOTE — Patient Instructions (Signed)
-  Get bloodwork/labs -Let us know about medicaid (so you can get referred to Gastroenterologist)

## 2022-07-05 ENCOUNTER — Ambulatory Visit: Payer: Medicaid Other | Admitting: Internal Medicine

## 2022-07-29 ENCOUNTER — Ambulatory Visit: Payer: Medicaid Other | Admitting: Internal Medicine

## 2022-07-29 ENCOUNTER — Encounter: Payer: Self-pay | Admitting: Internal Medicine

## 2022-07-29 VITALS — BP 120/80 | HR 80 | Ht 69.0 in | Wt 241.6 lb

## 2022-07-29 DIAGNOSIS — L130 Dermatitis herpetiformis: Secondary | ICD-10-CM | POA: Diagnosis not present

## 2022-07-29 DIAGNOSIS — Z131 Encounter for screening for diabetes mellitus: Secondary | ICD-10-CM | POA: Diagnosis not present

## 2022-07-29 DIAGNOSIS — Z1159 Encounter for screening for other viral diseases: Secondary | ICD-10-CM

## 2022-07-29 DIAGNOSIS — Z13220 Encounter for screening for lipoid disorders: Secondary | ICD-10-CM | POA: Diagnosis not present

## 2022-07-29 DIAGNOSIS — Z1329 Encounter for screening for other suspected endocrine disorder: Secondary | ICD-10-CM | POA: Diagnosis not present

## 2022-07-29 DIAGNOSIS — K529 Noninfective gastroenteritis and colitis, unspecified: Secondary | ICD-10-CM | POA: Diagnosis not present

## 2022-07-29 DIAGNOSIS — Z114 Encounter for screening for human immunodeficiency virus [HIV]: Secondary | ICD-10-CM

## 2022-07-29 DIAGNOSIS — Z1321 Encounter for screening for nutritional disorder: Secondary | ICD-10-CM | POA: Diagnosis not present

## 2022-07-29 DIAGNOSIS — Z0001 Encounter for general adult medical examination with abnormal findings: Secondary | ICD-10-CM | POA: Diagnosis not present

## 2022-07-29 NOTE — Patient Instructions (Signed)
It was a pleasure to see you today.  Thank you for giving [place] the opportunity to be involved in your care.  Below is a brief recap of your visit and next steps.  We will plan to see you again in 6 months.  Summary You have established care today We will check labs Plan for follow up in 6 month

## 2022-07-29 NOTE — Assessment & Plan Note (Signed)
Presenting today to establish care.  Previous records and labs been reviewed. -Baseline labs ordered today, including one-time HIV/HCV screenings -He does not intend to receive any additional COVID-19 vaccines.  Vaccinations are otherwise up-to-date. -We will tentatively plan for follow-up in 6 months

## 2022-07-29 NOTE — Assessment & Plan Note (Signed)
He endorses a several year history of diarrhea, experiencing 3-4 episodes daily.  An etiology has never been identified.  He has previously discussed referral to gastroenterology with his provider at the free clinic of [facility].  On exam today he has a rash present on his elbows and knees that seems consistent with dermatitis herpetiformis. -Screening for celiac disease ordered today -Consider referral to gastroenterology pending results

## 2022-07-29 NOTE — Progress Notes (Signed)
New Patient Office Visit  Subjective    Patient ID: Juan Salazar, male    DOB: 1981-06-01  Age: 42 y.o. MRN: [id number]  CC:  Chief Complaint  Patient presents with   Establish Care   HPI JAMAH KEILTY presents to establish care he is a 42 year old male who has previously been followed by the free clinic of [facility].  Juan Salazar reports feeling well today.  He has no acute concerns to discuss and is asymptomatic.  He endorses a past medical history significant for chronic diarrhea and psoriasis (self diagnosed).  He does not take any medications regularly.  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  He denies tobacco, alcohol, and illicit drug use.  His family medical history is significant for CAD.  Chronic medical conditions and outstanding preventative care items discussed today are individually addressed next as below.  No outpatient encounter medications on file as of 07/29/2022.   No facility-administered encounter medications on file as of 07/29/2022.    Past Medical History:  Diagnosis Date   Psoriasis     History reviewed. No pertinent surgical history.  Family History  Problem Relation Age of Onset   Hyperlipidemia Mother    Heart disease Mother    Heart disease Father     Social History   Socioeconomic History   Marital status: Married    Spouse name: Not on file   Number of children: Not on file   Years of education: Not on file   Highest education level: Not on file  Occupational History   Not on file  Tobacco Use   Smoking status: Never   Smokeless tobacco: Never  Vaping Use   Vaping Use: Never used  Substance and Sexual Activity   Alcohol use: Not Currently    Comment: rarely   Drug use: No   Sexual activity: Not on file  Other Topics Concern   Not on file  Social History Narrative   ** Merged History Encounter **       Social Determinants of Health   Financial Resource Strain: Not on file  Food Insecurity: Not on file  Transportation Needs:  Not on file  Physical Activity: Not on file  Stress: Not on file  Social Connections: Not on file  Intimate Partner Violence: Not on file   Review of Systems  Gastrointestinal:  Positive for diarrhea (Chronic).  Skin:  Positive for rash (Extensor surfaces of elbows and knees).   Objective    BP 120/80   Pulse 80   Ht 5' 9"$  (1.753 m)   Wt 241 lb 9.6 oz (109.6 kg)   SpO2 96%   BMI 35.68 kg/m   Physical Exam Vitals reviewed.  Constitutional:      General: He is not in acute distress.    Appearance: Normal appearance. He is obese. He is not ill-appearing.  HENT:     Head: Normocephalic and atraumatic.     Right Ear: External ear normal.     Left Ear: External ear normal.     Nose: Nose normal. No congestion or rhinorrhea.     Mouth/Throat:     Mouth: Mucous membranes are moist.     Pharynx: Oropharynx is clear.  Eyes:     General: No scleral icterus.    Extraocular Movements: Extraocular movements intact.     Conjunctiva/sclera: Conjunctivae normal.     Pupils: Pupils are equal, round, and reactive to light.  Cardiovascular:     Rate and Rhythm:  Normal rate and regular rhythm.     Pulses: Normal pulses.     Heart sounds: Normal heart sounds. No murmur heard. Pulmonary:     Effort: Pulmonary effort is normal.     Breath sounds: Normal breath sounds. No wheezing, rhonchi or rales.  Abdominal:     General: Abdomen is flat. Bowel sounds are normal. There is no distension.     Palpations: Abdomen is soft.     Tenderness: There is no abdominal tenderness.  Musculoskeletal:        General: No swelling or deformity. Normal range of motion.     Cervical back: Normal range of motion.  Skin:    General: Skin is warm and dry.     Capillary Refill: Capillary refill takes less than 2 seconds.     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  Neurological:     General: No focal deficit present.     Mental  Status: He is alert and oriented to person, place, and time.     Motor: No weakness.  Psychiatric:        Mood and Affect: Mood normal.        Behavior: Behavior normal.        Thought Content: Thought content normal.   Last CBC Lab Results  Component Value Date   WBC 11.1 (H) 08/28/2021   HGB 13.7 08/28/2021   HCT 44.2 08/28/2021   MCV 84.5 08/28/2021   MCH 26.2 08/28/2021   RDW 16.3 (H) 08/28/2021   PLT 258 [id number]   Last metabolic panel Lab Results  Component Value Date   GLUCOSE 113 (H) 08/29/2021   NA 130 (L) 08/29/2021   K 4.3 08/29/2021   CL 98 08/29/2021   CO2 23 08/29/2021   BUN 12 08/29/2021   CREATININE 1.49 (H) 08/29/2021   GFRNONAA >60 08/29/2021   CALCIUM 8.2 (L) 08/29/2021   PROT 7.7 08/28/2021   ALBUMIN 3.7 08/28/2021   BILITOT 0.8 08/28/2021   ALKPHOS 90 08/28/2021   AST 44 (H) 08/28/2021   ALT 31 08/28/2021   ANIONGAP 9 08/29/2021   Last lipids Lab Results  Component Value Date   CHOL 145 06/24/2021   HDL 31 (L) 06/24/2021   LDLCALC 90 06/24/2021   TRIG 118 06/24/2021   CHOLHDL 4.7 06/24/2021   Last hemoglobin A1c Lab Results  Component Value Date   HGBA1C 5.4 06/24/2021   Assessment & Plan:   Problem List Items Addressed This Visit       Chronic diarrhea    He endorses a several year history of diarrhea, experiencing 3-4 episodes daily.  An etiology has never been identified.  He has previously discussed referral to gastroenterology with his provider at the free clinic of [facility].  On exam today he has a rash present on his elbows and knees that seems consistent with dermatitis herpetiformis. -Screening for celiac disease ordered today -Consider referral to gastroenterology pending results      Encounter for general adult medical examination with abnormal findings - Primary    Presenting today to establish care.  Previous records and labs been reviewed. -Baseline labs ordered today, including one-time HIV/HCV  screenings -He does not intend to receive any additional COVID-19 vaccines.  Vaccinations are otherwise up-to-date. -We will tentatively plan for follow-up in 6 months      Return in about 6 months (around 01/27/2023).   Johnette Abraham, MD

## 2022-08-02 ENCOUNTER — Other Ambulatory Visit: Payer: Self-pay | Admitting: Internal Medicine

## 2022-08-02 DIAGNOSIS — E559 Vitamin D deficiency, unspecified: Secondary | ICD-10-CM

## 2022-08-02 LAB — HEMOGLOBIN A1C
Est. average glucose Bld gHb Est-mCnc: 120 mg/dL
Hgb A1c MFr Bld: 5.8 % — ABNORMAL HIGH (ref 4.8–5.6)

## 2022-08-02 LAB — CBC WITH DIFFERENTIAL/PLATELET
Basophils Absolute: 0.1 10*3/uL (ref 0.0–0.2)
Basos: 1 %
EOS (ABSOLUTE): 0.2 10*3/uL (ref 0.0–0.4)
Eos: 2 %
Hematocrit: 40.2 % (ref 37.5–51.0)
Hemoglobin: 13.1 g/dL (ref 13.0–17.7)
Immature Grans (Abs): 0 10*3/uL (ref 0.0–0.1)
Immature Granulocytes: 0 %
Lymphocytes Absolute: 2.3 10*3/uL (ref 0.7–3.1)
Lymphs: 30 %
MCH: 27.2 pg (ref 26.6–33.0)
MCHC: 32.6 g/dL (ref 31.5–35.7)
MCV: 84 fL (ref 79–97)
Monocytes Absolute: 0.5 10*3/uL (ref 0.1–0.9)
Monocytes: 7 %
Neutrophils Absolute: 4.8 10*3/uL (ref 1.4–7.0)
Neutrophils: 60 %
Platelets: 312 10*3/uL (ref 150–450)
RBC: 4.81 x10E6/uL (ref 4.14–5.80)
RDW: 15.1 % (ref 11.6–15.4)
WBC: 7.9 10*3/uL (ref 3.4–10.8)

## 2022-08-02 LAB — CMP14+EGFR
ALT: 26 IU/L (ref 0–44)
AST: 26 IU/L (ref 0–40)
Albumin/Globulin Ratio: 1.5 (ref 1.2–2.2)
Albumin: 4.4 g/dL (ref 4.1–5.1)
Alkaline Phosphatase: 121 IU/L (ref 44–121)
BUN/Creatinine Ratio: 9 (ref 9–20)
BUN: 11 mg/dL (ref 6–20)
Bilirubin Total: <0.2 mg/dL (ref 0.0–1.2)
CO2: 23 mmol/L (ref 20–29)
Calcium: 9.7 mg/dL (ref 8.7–10.2)
Chloride: 102 mmol/L (ref 96–106)
Creatinine, Ser: 1.16 mg/dL (ref 0.76–1.27)
Globulin, Total: 2.9 g/dL (ref 1.5–4.5)
Glucose: 80 mg/dL (ref 70–99)
Potassium: 4.6 mmol/L (ref 3.5–5.2)
Sodium: 141 mmol/L (ref 134–144)
Total Protein: 7.3 g/dL (ref 6.0–8.5)
eGFR: 82 mL/min/{1.73_m2} (ref >59)

## 2022-08-02 LAB — LIPID PANEL
Chol/HDL Ratio: 6 ratio — ABNORMAL HIGH (ref 0.0–5.0)
Cholesterol, Total: 174 mg/dL (ref 100–199)
HDL: 29 mg/dL — ABNORMAL LOW (ref >39)
LDL Chol Calc (NIH): 82 mg/dL (ref 0–99)
Triglycerides: 386 mg/dL — ABNORMAL HIGH (ref 0–149)
VLDL Cholesterol Cal: 63 mg/dL — ABNORMAL HIGH (ref 5–40)

## 2022-08-02 LAB — HIV ANTIBODY (ROUTINE TESTING W REFLEX): HIV Screen 4th Generation wRfx: NONREACTIVE

## 2022-08-02 LAB — TSH+FREE T4
Free T4: 0.95 ng/dL (ref 0.82–1.77)
TSH: 2.78 u[IU]/mL (ref 0.450–4.500)

## 2022-08-02 LAB — CELIAC AB TTG DGP TIGA
Antigliadin Abs, IgA: 22 units — ABNORMAL HIGH (ref 0–19)
Gliadin IgG: 3 units (ref 0–19)
IgA/Immunoglobulin A, Serum: 304 mg/dL (ref 90–386)
Tissue Transglut Ab: <2 U/mL (ref 0–5)
Transglutaminase IgA: <2 U/mL (ref 0–3)

## 2022-08-02 LAB — HCV INTERPRETATION

## 2022-08-02 LAB — VITAMIN D 25 HYDROXY (VIT D DEFICIENCY, FRACTURES): Vit D, 25-Hydroxy: 13.9 ng/mL — ABNORMAL LOW (ref 30.0–100.0)

## 2022-08-02 LAB — B12 AND FOLATE PANEL
Folate: 16.5 ng/mL (ref >3.0)
Vitamin B-12: 444 pg/mL (ref 232–1245)

## 2022-08-02 LAB — HCV AB W REFLEX TO QUANT PCR: HCV Ab: NONREACTIVE

## 2022-08-02 MED ORDER — VITAMIN D (ERGOCALCIFEROL) 1.25 MG (50000 UNIT) PO CAPS: 50000.0000 [IU] | ORAL_CAPSULE | ORAL | 0 refills | Status: AC

## 2022-08-09 ENCOUNTER — Telehealth: Payer: Self-pay | Admitting: Internal Medicine

## 2022-08-09 DIAGNOSIS — L130 Dermatitis herpetiformis: Secondary | ICD-10-CM

## 2022-08-09 NOTE — Telephone Encounter (Signed)
Called in on patient behalf. Wants to know if provider is going to send in cream for patient cirrhosis on knee  Patient knees are cracking, breaking out, and painful.  Patient/ Juan Salazar wants a call back in regard

## 2022-08-10 MED ORDER — TRIAMCINOLONE ACETONIDE 0.1 % EX CREA: 1.0000 | TOPICAL_CREAM | Freq: Two times a day (BID) | CUTANEOUS | 0 refills | Status: DC

## 2022-08-10 NOTE — Telephone Encounter (Signed)
Rx for triamcinolone cream sent to [facility].

## 2022-08-10 NOTE — Telephone Encounter (Signed)
Patient aware.

## 2022-08-23 ENCOUNTER — Other Ambulatory Visit: Payer: Self-pay

## 2022-08-23 ENCOUNTER — Telehealth: Payer: Self-pay | Admitting: Internal Medicine

## 2022-08-23 DIAGNOSIS — K529 Noninfective gastroenteritis and colitis, unspecified: Secondary | ICD-10-CM

## 2022-08-23 NOTE — Telephone Encounter (Signed)
Pt is wanting a referral to Gastro. Can you please place referral to [address] (upstairs).

## 2022-08-23 NOTE — Telephone Encounter (Signed)
Referral placed.

## 2022-08-26 ENCOUNTER — Encounter: Payer: Self-pay | Admitting: *Deleted

## 2022-09-21 ENCOUNTER — Ambulatory Visit: Payer: Medicaid Other | Admitting: Internal Medicine

## 2022-10-23 NOTE — Progress Notes (Unsigned)
GI Office Note    Referring Provider: Billie Lade, MD Primary Care Physician:  Billie Lade, MD  Primary Gastroenterologist:  Chief Complaint   No chief complaint on file.    History of Present Illness   Juan Salazar is a 42 y.o. male presenting today at the request of Dr. Durwin Nora for further evaluation of chronic diarrhea.     Recent labs on July 29, 2022: IgA 304, antigliadin IgA weakly positive at 22, gliadin IgG negative, TTG IgA negative, TTG IgG negative, HCV antibody negative, HIV nonreactive, CBC normal, CMET normal, A1c 5.8, TSH 2.780, B12 444, folate 16.5.     Medications   Current Outpatient Medications  Medication Sig Dispense Refill   triamcinolone cream (KENALOG) 0.1 % Apply 1 Application topically 2 (two) times daily. 30 g 0   No current facility-administered medications for this visit.    Allergies   Allergies as of 10/24/2022   (No Known Allergies)    Past Medical History   Past Medical History:  Diagnosis Date   Psoriasis     Past Surgical History   No past surgical history on file.  Past Family History   Family History  Problem Relation Age of Onset   Hyperlipidemia Mother    Heart disease Mother    Heart disease Father     Past Social History   Social History   Socioeconomic History   Marital status: Married    Spouse name: Not on file   Number of children: Not on file   Years of education: Not on file   Highest education level: Not on file  Occupational History   Not on file  Tobacco Use   Smoking status: Never   Smokeless tobacco: Never  Vaping Use   Vaping Use: Never used  Substance and Sexual Activity   Alcohol use: Not Currently    Comment: rarely   Drug use: No   Sexual activity: Not on file  Other Topics Concern   Not on file  Social History Narrative   ** Merged History Encounter **       Social Determinants of Health   Financial Resource Strain: Not on file  Food Insecurity: Not  on file  Transportation Needs: Not on file  Physical Activity: Not on file  Stress: Not on file  Social Connections: Not on file  Intimate Partner Violence: Not on file    Review of Systems   General: Negative for anorexia, weight loss, fever, chills, fatigue, weakness. Eyes: Negative for vision changes.  ENT: Negative for hoarseness, difficulty swallowing , nasal congestion. CV: Negative for chest pain, angina, palpitations, dyspnea on exertion, peripheral edema.  Respiratory: Negative for dyspnea at rest, dyspnea on exertion, cough, sputum, wheezing.  GI: See history of present illness. GU:  Negative for dysuria, hematuria, urinary incontinence, urinary frequency, nocturnal urination.  MS: Negative for joint pain, low back pain.  Derm: Negative for rash or itching.  Neuro: Negative for weakness, abnormal sensation, seizure, frequent headaches, memory loss,  confusion.  Psych: Negative for anxiety, depression, suicidal ideation, hallucinations.  Endo: Negative for unusual weight change.  Heme: Negative for bruising or bleeding. Allergy: Negative for rash or hives.  Physical Exam   There were no vitals taken for this visit.   General: Well-nourished, well-developed in no acute distress.  Head: Normocephalic, atraumatic.   Eyes: Conjunctiva pink, no icterus. Mouth: Oropharyngeal mucosa moist and pink , no lesions erythema or exudate. Neck: Supple without thyromegaly,  masses, or lymphadenopathy.  Lungs: Clear to auscultation bilaterally.  Heart: Regular rate and rhythm, no murmurs rubs or gallops.  Abdomen: Bowel sounds are normal, nontender, nondistended, no hepatosplenomegaly or masses,  no abdominal bruits or hernia, no rebound or guarding.   Rectal: *** Extremities: No lower extremity edema. No clubbing or deformities.  Neuro: Alert and oriented x 4 , grossly normal neurologically.  Skin: Warm and dry, no rash or jaundice.   Psych: Alert and cooperative, normal mood and  affect.  Labs   *** Imaging Studies   No results found.  Assessment       PLAN   ***   Leanna Battles. Melvyn Neth, MHS, PA-C [facility]

## 2022-10-24 ENCOUNTER — Encounter: Payer: Self-pay | Admitting: Gastroenterology

## 2022-10-24 ENCOUNTER — Ambulatory Visit: Payer: Medicaid Other | Admitting: Gastroenterology

## 2022-10-24 ENCOUNTER — Other Ambulatory Visit: Payer: Self-pay | Admitting: *Deleted

## 2022-10-24 ENCOUNTER — Encounter: Payer: Self-pay | Admitting: *Deleted

## 2022-10-24 VITALS — BP 115/79 | HR 83 | Temp 98.0°F | Ht 68.5 in | Wt 231.4 lb

## 2022-10-24 DIAGNOSIS — K529 Noninfective gastroenteritis and colitis, unspecified: Secondary | ICD-10-CM

## 2022-10-24 MED ORDER — CLENPIQ 10-3.5-12 MG-GM -GM/175ML PO SOLN: 1.0000 | ORAL | 0 refills | Status: DC

## 2022-10-24 NOTE — Patient Instructions (Signed)
Please complete labs. Colonoscopy in near future.

## 2022-10-25 ENCOUNTER — Telehealth: Payer: Self-pay

## 2022-10-25 ENCOUNTER — Encounter: Payer: Self-pay | Admitting: Gastroenterology

## 2022-10-25 NOTE — Telephone Encounter (Signed)
Pt's wife called stating that seeing an allergist was recommended. Wife is wanting to know if we are going to refer him to one. Please advise.

## 2022-10-25 NOTE — Telephone Encounter (Signed)
Actually we did discuss this but this would be pending Alpha-Gal results.

## 2022-10-25 NOTE — Telephone Encounter (Signed)
Left message informing pt that referral will be dependent upon lab results and that we will call him back once results are available with recommendations.

## 2022-10-28 ENCOUNTER — Telehealth: Payer: Self-pay | Admitting: *Deleted

## 2022-10-28 LAB — ALPHA-GAL PANEL
Allergen Lamb IgE: <0.1 kU/L
Beef IgE: <0.1 kU/L
IgE (Immunoglobulin E), Serum: <2 IU/mL — ABNORMAL LOW (ref 6–495)
O215-IgE Alpha-Gal: <0.1 kU/L
Pork IgE: <0.1 kU/L

## 2022-10-28 LAB — C-REACTIVE PROTEIN: CRP: 45 mg/L — ABNORMAL HIGH (ref 0–10)

## 2022-10-28 LAB — SEDIMENTATION RATE: Sed Rate: 21 mm/hr — ABNORMAL HIGH (ref 0–15)

## 2022-10-28 NOTE — Telephone Encounter (Signed)
Pt needs to cancel his procedure for 11/22/22. He is wanting a day in July. Advised don't have providers schedule at this time, will call once we get it.

## 2022-11-02 NOTE — Telephone Encounter (Signed)
Pt was made aware and verbalized understanding.  

## 2022-11-02 NOTE — Telephone Encounter (Signed)
See result note. Would advise colonoscopy, if negative, then send to allergist. Right now no indication to see allergist, alpha gal lab ok. I suspect colonoscopy will shed light on cause for his diarrhea given his elevated sed rate, crp.

## 2022-11-22 ENCOUNTER — Ambulatory Visit (HOSPITAL_COMMUNITY): Admit: 2022-11-22 | Payer: Medicaid Other

## 2022-11-22 ENCOUNTER — Telehealth: Payer: Medicaid Other | Admitting: Internal Medicine

## 2022-11-22 ENCOUNTER — Encounter (HOSPITAL_COMMUNITY): Payer: Self-pay

## 2022-11-22 SURGERY — COLONOSCOPY WITH PROPOFOL
Anesthesia: Monitor Anesthesia Care

## 2022-11-25 ENCOUNTER — Encounter: Payer: Self-pay | Admitting: Internal Medicine

## 2022-11-25 ENCOUNTER — Encounter (INDEPENDENT_AMBULATORY_CARE_PROVIDER_SITE_OTHER): Payer: Medicaid Other | Admitting: Internal Medicine

## 2022-11-25 NOTE — Progress Notes (Signed)
Appointment not needed.  Please disregard.

## 2022-11-28 ENCOUNTER — Other Ambulatory Visit: Payer: Self-pay

## 2022-11-28 ENCOUNTER — Encounter: Payer: Self-pay | Admitting: *Deleted

## 2022-11-28 DIAGNOSIS — Z0182 Encounter for allergy testing: Secondary | ICD-10-CM

## 2022-11-28 NOTE — Telephone Encounter (Signed)
Pt has been rescheduled for 12/27/22. Updated instructions mailed.

## 2022-12-12 ENCOUNTER — Telehealth: Payer: Self-pay | Admitting: *Deleted

## 2022-12-12 NOTE — Telephone Encounter (Signed)
Received VM from Elease Hashimoto needing to reschedule patient upcoming procedure with Dr. Marletta Lor on 7/9. Called back and LMOVM to call back

## 2022-12-12 NOTE — Telephone Encounter (Signed)
Pt spouse called back. Pt wants to reschedule for August. Advised we don't have that schedule yet. Will call once we do. Endo aware.

## 2022-12-27 ENCOUNTER — Encounter (HOSPITAL_COMMUNITY): Payer: Self-pay

## 2022-12-27 ENCOUNTER — Telehealth (INDEPENDENT_AMBULATORY_CARE_PROVIDER_SITE_OTHER): Payer: Medicaid Other | Admitting: Internal Medicine

## 2022-12-27 ENCOUNTER — Ambulatory Visit (HOSPITAL_COMMUNITY): Admit: 2022-12-27 | Payer: Medicaid Other

## 2022-12-27 ENCOUNTER — Encounter: Payer: Self-pay | Admitting: Internal Medicine

## 2022-12-27 DIAGNOSIS — R252 Cramp and spasm: Secondary | ICD-10-CM | POA: Diagnosis not present

## 2022-12-27 DIAGNOSIS — E559 Vitamin D deficiency, unspecified: Secondary | ICD-10-CM | POA: Diagnosis not present

## 2022-12-27 SURGERY — COLONOSCOPY WITH PROPOFOL
Anesthesia: Monitor Anesthesia Care

## 2022-12-27 MED ORDER — VITAMIN D3 25 MCG (1000 UT) PO CAPS: 1000.0000 [IU] | ORAL_CAPSULE | Freq: Every day | ORAL | 0 refills | Status: AC

## 2022-12-27 NOTE — Progress Notes (Signed)
   Virtual Visit via Video Note  I connected with Juan Salazar on 12/27/22 at  4:20 PM EDT by a video enabled telemedicine application and verified that I am speaking with the correct person using two identifiers.  Patient Location: Home Provider Location: Office/Clinic  I discussed the limitations, risks, security, and privacy concerns of performing an evaluation and management service by video and the availability of in person appointments. I also discussed with the patient that there may be a patient responsible charge related to this service. The patient expressed understanding and agreed to proceed.  Subjective: PCP: Billie Lade, MD  No chief complaint on file.  Patient had question regarding referral to allergy and immunology referral. I reviewed chart and referral placed. Patient given number to call and make appointment.   Patient started having back pain one month ago. Pack pain improved. Then he started getting a cramping pain in his right hip and in his calf. No radiation of pain. He wears boots at work and does not have pain. He wears crocs and pain is worse. He can not put a number on how many times its occurring , but not occurring frequently.   Patient has vitamin D deficiency. He was started on weekly supplementing and has ran out of Vitamin D. He picked some up at pharmacy and has been taking 10k international unit daily. He ask how much he should be taking.   ROS: Per HPI  Current Outpatient Medications:    Cholecalciferol (VITAMIN D3) 25 MCG (1000 UT) CAPS, Take 1 capsule (1,000 Units total) by mouth daily., Disp: 60 capsule, Rfl: 0   Sod Picosulfate-Mag Ox-Cit Acd (CLENPIQ) 10-3.5-12 MG-GM -GM/175ML SOLN, Take 1 kit by mouth as directed., Disp: 350 mL, Rfl: 0   triamcinolone cream (KENALOG) 0.1 %, Apply 1 Application topically 2 (two) times daily., Disp: 30 g, Rfl: 0  Observations/Objective: There were no vitals filed for this visit.   Assessment and  Plan: Vitamin D deficiency Assessment & Plan: Chronic problem, stable  Review of results show Vitamin D 13.9  He has finished high dose supplement for 8 weeks Prescribed D3 1000 international units  daily.  Has follow up in August for recheck.  Orders: -     Vitamin D3; Take 1 capsule (1,000 Units total) by mouth daily.  Dispense: 60 capsule; Refill: 0  Muscle cramp Assessment & Plan: Acute uncomplicated problem I expect this is from his chronic diarrhea. Currently undergoing workup .  Recommend staying hydrated He has follow up in August. Recommended discussing with Dr.Dixon if still occurring.      Follow Up Instructions: No follow-ups on file.   I discussed the assessment and treatment plan with the patient. The patient was provided an opportunity to ask questions, and all were answered. The patient agreed with the plan and demonstrated an understanding of the instructions.   The patient was advised to call back or seek an in-person evaluation if the symptoms worsen or if the condition fails to improve as anticipated.  The above assessment and management plan was discussed with the patient. The patient verbalized understanding of and has agreed to the management plan.   Milus Banister, MD

## 2022-12-27 NOTE — Assessment & Plan Note (Signed)
Chronic problem, stable  Review of results show Vitamin D 13.9  He has finished high dose supplement for 8 weeks Prescribed D3 1000 international units  daily.  Has follow up in August for recheck.

## 2022-12-27 NOTE — Assessment & Plan Note (Addendum)
Acute uncomplicated problem I expect this is from his chronic diarrhea. Currently undergoing workup .  Recommend staying hydrated He has follow up in August. Recommended discussing with Dr.Dixon if still occurring.

## 2022-12-27 NOTE — Patient Instructions (Signed)
Thank you, [name] for allowing [place] to provide your care today.   Vitamin D sent to pharmacy  Stay hydrated as discussed.   Reminders: Follow up with Dr.Dixon as scheduled     Thurmon Fair, M.D.  v

## 2023-01-05 ENCOUNTER — Encounter: Payer: Self-pay | Admitting: *Deleted

## 2023-01-23 ENCOUNTER — Ambulatory Visit: Payer: Medicaid Other | Admitting: Internal Medicine

## 2023-01-23 ENCOUNTER — Encounter: Payer: Self-pay | Admitting: Internal Medicine

## 2023-01-23 ENCOUNTER — Other Ambulatory Visit: Payer: Self-pay

## 2023-01-23 VITALS — BP 128/88 | HR 90 | Temp 98.5°F | Resp 18 | Ht 67.56 in | Wt 233.0 lb

## 2023-01-23 DIAGNOSIS — T781XXD Other adverse food reactions, not elsewhere classified, subsequent encounter: Secondary | ICD-10-CM

## 2023-01-23 DIAGNOSIS — J3089 Other allergic rhinitis: Secondary | ICD-10-CM

## 2023-01-23 DIAGNOSIS — J301 Allergic rhinitis due to pollen: Secondary | ICD-10-CM | POA: Diagnosis not present

## 2023-01-23 DIAGNOSIS — K529 Noninfective gastroenteritis and colitis, unspecified: Secondary | ICD-10-CM | POA: Diagnosis not present

## 2023-01-23 MED ORDER — CETIRIZINE HCL 10 MG PO TABS
10.0000 mg | ORAL_TABLET | Freq: Every day | ORAL | 5 refills | Status: DC
Start: 2023-01-23 — End: 2023-05-26

## 2023-01-23 MED ORDER — AZELASTINE HCL 0.1 % NA SOLN: 1.0000 | Freq: Two times a day (BID) | NASAL | 5 refills | Status: AC | PRN

## 2023-01-23 MED ORDER — FLUTICASONE PROPIONATE 50 MCG/ACT NA SUSP: 2.0000 | Freq: Every day | NASAL | 5 refills | Status: AC

## 2023-01-23 NOTE — Patient Instructions (Addendum)
Chronic Diarrhea - Negative alpha gal, rules out mammalian meat allergy.  - Consider low FODMAP diet. - Follow up with GI doctors.   Allergic Rhinitis: - Positive skin test 01/2023: grasses and molds  - Avoidance measures discussed. - Use nasal saline rinses before nose sprays such as with Neilmed Sinus Rinse.  Use distilled water.   - Use Flonase 2 sprays each nostril daily. Aim upward and outward. - Use Azelastine 1-2 sprays each nostril twice daily as needed for runny nose, drainage, sneezing, congestion. Aim upward and outward. - Use Zyrtec 10 mg daily.  - Consider allergy shots as long term control of your symptoms by teaching your immune system to be more tolerant of your allergy triggers   ALLERGEN AVOIDANCE MEASURES  Molds - Indoor avoidance Use air conditioning to reduce indoor humidity.  Do not use a humidifier. Keep indoor humidity at 30 - 40%.  Use a dehumidifier if needed. In the bathroom use an exhaust fan or open a window after showering.  Wipe down damp surfaces after showering.  Clean bathrooms with a mold-killing solution (diluted bleach, or products like Tilex, etc) at least once a month. In the kitchen use an exhaust fan to remove steam from cooking.  Throw away spoiled foods immediately, and empty garbage daily.  Empty water pans below self-defrosting refrigerators frequently. Vent the clothes dryer to the outside. Limit indoor houseplants; mold grows in the dirt.  No houseplants in the bedroom. Remove carpet from the bedroom. Encase the mattress and box springs with a zippered encasing.  Molds - Outdoor avoidance Avoid being outside when the grass is being mowed, or the ground is tilled. Avoid playing in leaves, pine straw, hay, etc.  Dead plant materials contain mold. Avoid going into barns or grain storage areas. Remove leaves, clippings and compost from around the home.  Pollen Avoidance Pollen levels are highest during the mid-day and afternoon.  Consider  this when planning outdoor activities. Avoid being outside when the grass is being mowed, or wear a mask if the pollen-allergic person must be the one to mow the grass. Keep the windows closed to keep pollen outside of the home. Use an air conditioner to filter the air. Take a shower, wash hair, and change clothing after working or playing outdoors during pollen season.

## 2023-01-23 NOTE — Progress Notes (Signed)
NEW PATIENT  Date of Service/Encounter:  01/23/23  Consult requested by: Billie Lade, MD   Subjective:   Juan Salazar (DOB: Jun 20, 1981) is a 42 y.o. male who presents to the clinic on 01/23/2023 with a chief complaint of Asthma (Feels like he's going to throw up when he works under the house. ), Allergic Rhinitis  (Says he has had allergies all of his life. ), and Food Intolerance (Dairy, red meats cause him to use the bathroom a lot more. ) .    History obtained from: chart review and patient.   Concern for Food Allergies: Reports having chronic diarrhea, bloating, nausea for the past several years.  Seeing GI and they do have him scheduled for colonoscopy.  Noted that red meat and dairy worsen it.  Has had tick bites. Alpha gal IgE was negative 10/2022. Celiac antigliadin IgA mildly reactive.   Rhinitis:  Started around age 35s.  Symptoms include: nasal congestion and post nasal drainage wonders if this is asthma but never had asthma in childhood.  Occurs year-round Potential triggers: dust, grass.  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   Treatments tried:  Zyrtec PRN; last use was over 3 days ago   Previous allergy testing: no History of sinus surgery: no Nonallergic triggers: none    Past Medical History: Past Medical History:  Diagnosis Date   Psoriasis    Past Surgical History: Past Surgical History:  Procedure Laterality Date   none      Family History: Family History  Problem Relation Age of Onset   Hyperlipidemia Mother    Heart disease Mother    Heart disease Father    Psychosis Father    Psychosis Brother    Colon cancer Neg Hx    Celiac disease Neg Hx    Inflammatory bowel disease Neg Hx     Social History:  Flooring in bedroom: Engineer, civil (consulting) Pets: dog Tobacco use/exposure: none Job: plumber   Medication List:  Allergies as of 01/23/2023   No Known Allergies      Medication List        Accurate as of January 23, 2023  3:41 PM. If you have  any questions, ask your nurse or doctor.          Clenpiq 10-3.5-12 MG-GM -GM/175ML Soln Generic drug: Sod Picosulfate-Mag Ox-Cit Acd Take 1 kit by mouth as directed.   triamcinolone cream 0.1 % Commonly known as: KENALOG Apply 1 Application topically 2 (two) times daily.   Vitamin D3 25 MCG (1000 UT) Caps Take 1 capsule (1,000 Units total) by mouth daily.         REVIEW OF SYSTEMS: Pertinent positives and negatives discussed in HPI.   Objective:   Physical Exam: BP 128/88   Pulse 90   Temp 98.5 F (36.9 C) (Temporal)   Resp 18   Ht 5' 7.56" (1.716 m)   Wt 233 lb (105.7 kg)   SpO2 98%   BMI 35.89 kg/m  Body mass index is 35.89 kg/m. GEN: alert, well developed HEENT: clear conjunctiva, TM grey and translucent, nose with + inferior turbinate hypertrophy, pink nasal mucosa, slight clear rhinorrhea, no cobblestoning HEART: regular rate and rhythm, no murmur LUNGS: clear to auscultation bilaterally, no coughing, unlabored respiration ABDOMEN: soft, non distended  SKIN: no rashes or lesions  Reviewed:  10/24/2022: seen by Melvyn Neth PA for chronic diarrhea. Weakly positive antigliadin IgA. Slightly elevated ESR/CRP.  Plan to do colonoscopy to rule out IBD.   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  Discussed celiac panel. Also performing screening labs.  PMH includes diarrhea and psoraisis.   08/27/2021: seen in urgent care for nausea, vomiting, diarrhea, fatigue, chills, body aches. Given Zofran and NS bolus with improvement. Discussed BRAT diet.    Skin Testing:  Skin prick testing was placed, which includes aeroallergens/foods, histamine control, and saline control.  Verbal consent was obtained prior to placing test.  Patient tolerated procedure well.  Allergy testing results were read and interpreted by myself, documented by clinical staff. Adequate positive and negative control.  Results discussed with  patient/family.  Airborne Adult Perc - 01/23/23 1508     Time Antigen Placed 1509    Allergen Manufacturer Waynette Buttery    Location Back    Number of Test 55    1. Control-Buffer 50% Glycerol Negative    2. Control-Histamine 3+    3. Bahia Negative    4. French Southern Territories Negative    5. Johnson Negative    6. Kentucky Blue 2+    7. Meadow Fescue Negative    8. Perennial Rye 2+    9. Timothy Negative    10. Ragweed Mix Negative    11. Cocklebur Negative    12. Plantain,  English Negative    13. Baccharis Negative    14. Dog Fennel Negative    15. Russian Thistle Negative    16. Lamb's Quarters Negative    17. Sheep Sorrell Negative    18. Rough Pigweed Negative    19. Marsh Elder, Rough Negative    20. Mugwort, Common Negative    21. Box, Elder Negative    22. Cedar, red Negative    23. Sweet Gum Negative    24. Pecan Pollen Negative    25. Pine Mix Negative    26. Walnut, Black Pollen Negative    27. Red Mulberry Negative    28. Ash Mix Negative    29. Birch Mix Negative    30. Beech American Negative    31. Cottonwood, Guinea-Bissau Negative    32. Hickory, White Negative    33. Maple Mix Negative    34. Oak, Guinea-Bissau Mix Negative    35. Sycamore Eastern Negative    36. Alternaria Alternata Negative    37. Cladosporium Herbarum Negative    38. Aspergillus Mix Negative    39. Penicillium Mix Negative    40. Bipolaris Sorokiniana (Helminthosporium) Negative    41. Drechslera Spicifera (Curvularia) Negative    42. Mucor Plumbeus Negative    43. Fusarium Moniliforme 3+    44. Aureobasidium Pullulans (pullulara) Negative    45. Rhizopus Oryzae Negative    46. Botrytis Cinera Negative    47. Epicoccum Nigrum Negative    48. Phoma Betae Negative    49. Dust Mite Mix Negative    50. Cat Hair 10,000 BAU/ml Negative    51.  Dog Epithelia Negative    52. Mixed Feathers Negative    53. Horse Epithelia Negative    54. Cockroach, German Negative    55. Tobacco Leaf Negative              13 Food Perc - 01/23/23 1509       Test Information   Time Antigen Placed 1509    Allergen Manufacturer Waynette Buttery    Location Back    Number of allergen test 13      Food   1. Peanut Negative    2. Soybean  Negative    3. Wheat Negative    4. Sesame Negative    5. Milk, Cow Negative    6. Casein Negative    7. Egg White, Chicken Negative    8. Shellfish Mix Negative    9. Fish Mix Negative    10. Cashew Negative    11. Walnut Food Negative    12. Almond Negative    13. Hazelnut Negative               Assessment:   1. Chronic diarrhea   2. Allergic rhinitis due to grass pollen   3. Allergic rhinitis caused by mold   4. Adverse food reaction, subsequent encounter     Plan/Recommendations:  Chronic Diarrhea - Negative alpha gal, rules out mammalian meat allergy.  - SPT negative 01/2023 to common food allergens. Symptoms also not consistent with IgE mediated food allergy.  - Consider low FODMAP diet. - Follow up with GI.  Allergic Rhinitis: - Due to turbinate hypertrophy and unresponsive to over the counter meds, performed skin testing to identify aeroallergen triggers.   - Positive skin test 01/2023: grasses and molds  - Avoidance measures discussed. - Use nasal saline rinses before nose sprays such as with Neilmed Sinus Rinse.  Use distilled water.   - Use Flonase 2 sprays each nostril daily. Aim upward and outward. - Use Azelastine 1-2 sprays each nostril twice daily as needed for runny nose, drainage, sneezing, congestion. Aim upward and outward. - Use Zyrtec 10 mg daily.  - Consider allergy shots as long term control of your symptoms by teaching your immune system to be more tolerant of your allergy triggers - If symptoms persist, we can discuss intradermal testing.  Wishes to hold off today due to recent procedure tomorrow.      Return in about 2 months (around 03/25/2023).  Alesia Morin, MD [facility]

## 2023-01-24 ENCOUNTER — Encounter (HOSPITAL_COMMUNITY): Payer: Self-pay

## 2023-01-24 ENCOUNTER — Ambulatory Visit (HOSPITAL_COMMUNITY): Payer: Medicaid Other | Admitting: Anesthesiology

## 2023-01-24 ENCOUNTER — Ambulatory Visit (HOSPITAL_BASED_OUTPATIENT_CLINIC_OR_DEPARTMENT_OTHER): Payer: Medicaid Other | Admitting: Anesthesiology

## 2023-01-24 ENCOUNTER — Ambulatory Visit (HOSPITAL_COMMUNITY)
Admission: RE | Admit: 2023-01-24 | Discharge: 2023-01-24 | Disposition: A | Discharge status: Home / Self Care | Payer: Medicaid Other | Attending: Internal Medicine | Admitting: Internal Medicine

## 2023-01-24 ENCOUNTER — Other Ambulatory Visit: Payer: Self-pay

## 2023-01-24 ENCOUNTER — Encounter (HOSPITAL_COMMUNITY)
Admission: RE | Disposition: A | Discharge status: Home / Self Care | Payer: Self-pay | Source: Home / Self Care | Attending: Internal Medicine

## 2023-01-24 DIAGNOSIS — K529 Noninfective gastroenteritis and colitis, unspecified: Secondary | ICD-10-CM

## 2023-01-24 DIAGNOSIS — K635 Polyp of colon: Secondary | ICD-10-CM | POA: Insufficient documentation

## 2023-01-24 DIAGNOSIS — D124 Benign neoplasm of descending colon: Secondary | ICD-10-CM | POA: Diagnosis not present

## 2023-01-24 DIAGNOSIS — L4050 Arthropathic psoriasis, unspecified: Secondary | ICD-10-CM | POA: Diagnosis not present

## 2023-01-24 DIAGNOSIS — D123 Benign neoplasm of transverse colon: Secondary | ICD-10-CM

## 2023-01-24 DIAGNOSIS — K5100 Ulcerative (chronic) pancolitis without complications: Secondary | ICD-10-CM | POA: Diagnosis not present

## 2023-01-24 DIAGNOSIS — K219 Gastro-esophageal reflux disease without esophagitis: Secondary | ICD-10-CM | POA: Diagnosis not present

## 2023-01-24 DIAGNOSIS — R1030 Lower abdominal pain, unspecified: Secondary | ICD-10-CM | POA: Diagnosis present

## 2023-01-24 DIAGNOSIS — K5140 Inflammatory polyps of colon without complications: Secondary | ICD-10-CM

## 2023-01-24 HISTORY — PX: COLONOSCOPY WITH PROPOFOL: SHX5780

## 2023-01-24 HISTORY — PX: BIOPSY: SHX5522

## 2023-01-24 HISTORY — PX: POLYPECTOMY: SHX149

## 2023-01-24 SURGERY — COLONOSCOPY WITH PROPOFOL
Anesthesia: General

## 2023-01-24 MED ORDER — PROPOFOL 10 MG/ML IV BOLUS
INTRAVENOUS | Status: DC | PRN
  Administered 2023-01-24 (×4): 50 mg via INTRAVENOUS
  Administered 2023-01-24: 150 mg via INTRAVENOUS
  Administered 2023-01-24 (×2): 50 mg via INTRAVENOUS

## 2023-01-24 MED ORDER — PROPOFOL 500 MG/50ML IV EMUL: INTRAVENOUS | Status: DC | PRN

## 2023-01-24 MED ORDER — LACTATED RINGERS IV SOLN: INTRAVENOUS | Status: DC

## 2023-01-24 MED ORDER — LIDOCAINE HCL 1 % IJ SOLN
INTRAMUSCULAR | Status: DC | PRN
  Administered 2023-01-24: 50 mg via INTRADERMAL

## 2023-01-24 MED ORDER — PROPOFOL 500 MG/50ML IV EMUL
INTRAVENOUS | Status: DC | PRN
  Administered 2023-01-24: 150 ug/kg/min via INTRAVENOUS

## 2023-01-24 NOTE — Anesthesia Preprocedure Evaluation (Addendum)
Anesthesia Evaluation  Patient identified by MRN, date of birth, ID band Patient awake    Reviewed: Allergy & Precautions, H&P , NPO status , Patient's Chart, lab work & pertinent test results  Airway Mallampati: III  TM Distance: >3 FB Neck ROM: Full    Dental  (+) Dental Advisory Given, Poor Dentition   Pulmonary neg pulmonary ROS   Pulmonary exam normal breath sounds clear to auscultation       Cardiovascular negative cardio ROS Normal cardiovascular exam Rhythm:Regular Rate:Normal     Neuro/Psych negative neurological ROS  negative psych ROS   GI/Hepatic Neg liver ROS,GERD  ,,  Endo/Other  negative endocrine ROS    Renal/GU negative Renal ROS  negative genitourinary   Musculoskeletal  (+) Arthritis  (psoriatic arthritis),    Abdominal   Peds negative pediatric ROS (+)  Hematology negative hematology ROS (+)   Anesthesia Other Findings Right ankle pain  Reproductive/Obstetrics negative OB ROS                             Anesthesia Physical Anesthesia Plan  ASA: 2  Anesthesia Plan: General   Post-op Pain Management: Minimal or no pain anticipated   Induction: Intravenous  PONV Risk Score and Plan: 1 and Propofol infusion  Airway Management Planned: Nasal Cannula and Natural Airway  Additional Equipment:   Intra-op Plan:   Post-operative Plan:   Informed Consent: I have reviewed the patients History and Physical, chart, labs and discussed the procedure including the risks, benefits and alternatives for the proposed anesthesia with the patient or authorized representative who has indicated his/her understanding and acceptance.     Dental advisory given  Plan Discussed with: CRNA and Surgeon  Anesthesia Plan Comments:        Anesthesia Quick Evaluation

## 2023-01-24 NOTE — Discharge Instructions (Addendum)
  Colonoscopy Discharge Instructions  Read the instructions outlined below and refer to this sheet in the next few weeks. These discharge instructions provide you with general information on caring for yourself after you leave the hospital. Your doctor may also give you specific instructions. While your treatment has been planned according to the most current medical practices available, unavoidable complications occasionally occur.   ACTIVITY You may resume your regular activity, but move at a slower pace for the next 24 hours.  Take frequent rest periods for the next 24 hours.  Walking will help get rid of the air and reduce the bloated feeling in your belly (abdomen).  No driving for 24 hours (because of the medicine (anesthesia) used during the test).   Do not sign any important legal documents or operate any machinery for 24 hours (because of the anesthesia used during the test).  NUTRITION Drink plenty of fluids.  You may resume your normal diet as instructed by your doctor.  Begin with a light meal and progress to your normal diet. Heavy or fried foods are harder to digest and may make you feel sick to your stomach (nauseated).  Avoid alcoholic beverages for 24 hours or as instructed.  MEDICATIONS You may resume your normal medications unless your doctor tells you otherwise.  WHAT YOU CAN EXPECT TODAY Some feelings of bloating in the abdomen.  Passage of more gas than usual.  Spotting of blood in your stool or on the toilet paper.  IF YOU HAD POLYPS REMOVED DURING THE COLONOSCOPY: No aspirin products for 7 days or as instructed.  No alcohol for 7 days or as instructed.  Eat a soft diet for the next 24 hours.  FINDING OUT THE RESULTS OF YOUR TEST Not all test results are available during your visit. If your test results are not back during the visit, make an appointment with your caregiver to find out the results. Do not assume everything is normal if you have not heard from your  caregiver or the medical facility. It is important for you to follow up on all of your test results.  SEEK IMMEDIATE MEDICAL ATTENTION IF: You have more than a spotting of blood in your stool.  Your belly is swollen (abdominal distention).  You are nauseated or vomiting.  You have a temperature over 101.  You have abdominal pain or discomfort that is severe or gets worse throughout the day.   Your colonoscopy revealed severe inflammation throughout your entire colon consistent with underlying ulcerative colitis.  I took numerous biopsies of your entire colon and end portion of your small bowel.  You also had multiple polyps which I removed.  One was quite large.  Await pathology results my office will contact you.  After I have reviewed pathology results, we will determine next steps.  Follow-up in GI office in 4 weeks. Office will call with appointment.  I hope you have a great rest of your week!  Hennie Duos. Marletta Lor, D.O. Gastroenterology and Hepatology [facility]

## 2023-01-24 NOTE — Anesthesia Postprocedure Evaluation (Signed)
Anesthesia Post Note  Patient: Juan Salazar  Procedure(s) Performed: COLONOSCOPY WITH PROPOFOL BIOPSY POLYPECTOMY INTESTINAL  Patient location during evaluation: Short Stay Anesthesia Type: General Level of consciousness: awake and alert Pain management: pain level controlled Vital Signs Assessment: post-procedure vital signs reviewed and stable Respiratory status: spontaneous breathing Cardiovascular status: blood pressure returned to baseline and stable Postop Assessment: no apparent nausea or vomiting Anesthetic complications: no   No notable events documented.   Last Vitals:  Vitals:   01/24/23 0900  BP: 125/89  Pulse: 68  Resp: 16  Temp: 36.8 C  SpO2: 98%    Last Pain:  Vitals:   01/24/23 0940  TempSrc:   PainSc: 0-No pain                 Akeria Hedstrom

## 2023-01-24 NOTE — H&P (Signed)
Primary Care Physician:  Billie Lade, MD Primary Gastroenterologist:  Dr. Marletta Lor  Pre-Procedure History & Physical: HPI:  Juan Salazar is a 42 y.o. male is here for a colonoscopy to be performed for chronic diarrhea, elevated CRP/ESR  Past Medical History:  Diagnosis Date   Psoriasis     Past Surgical History:  Procedure Laterality Date   none      Prior to Admission medications   Medication Sig Start Date End Date Taking? Authorizing Provider  azelastine (ASTELIN) 0.1 % nasal spray Place 1 spray into both nostrils 2 (two) times daily as needed. Use in each nostril as directed 01/23/23  Yes Birder Robson, MD  cetirizine (ZYRTEC ALLERGY) 10 MG tablet Take 1 tablet (10 mg total) by mouth daily. 01/23/23  Yes Birder Robson, MD  Cholecalciferol (VITAMIN D3) 25 MCG (1000 UT) CAPS Take 1 capsule (1,000 Units total) by mouth daily. 12/27/22  Yes Gardenia Phlegm, MD  fluticasone (FLONASE) 50 MCG/ACT nasal spray Place 2 sprays into both nostrils daily. 01/23/23  Yes Birder Robson, MD  Sod Picosulfate-Mag Ox-Cit Acd (CLENPIQ) 10-3.5-12 MG-GM -GM/175ML SOLN Take 1 kit by mouth as directed. 10/24/22  Yes Lanelle Bal, DO  triamcinolone cream (KENALOG) 0.1 % Apply 1 Application topically 2 (two) times daily. 08/10/22  Yes Billie Lade, MD    Allergies as of 01/05/2023   (No Known Allergies)    Family History  Problem Relation Age of Onset   Hyperlipidemia Mother    Heart disease Mother    Heart disease Father    Psychosis Father    Psychosis Brother    Colon cancer Neg Hx    Celiac disease Neg Hx    Inflammatory bowel disease Neg Hx     Social History   Socioeconomic History   Marital status: Married    Spouse name: Not on file   Number of children: Not on file   Years of education: Not on file   Highest education level: Not on file  Occupational History   Not on file  Tobacco Use   Smoking status: Never    Passive exposure: Current   Smokeless tobacco: Never   Vaping Use   Vaping status: Never Used  Substance and Sexual Activity   Alcohol use: Not Currently    Comment: rarely   Drug use: No   Sexual activity: Yes  Other Topics Concern   Not on file  Social History Narrative   ** Merged History Encounter **       Social Determinants of Health   Financial Resource Strain: Not on file  Food Insecurity: Not on file  Transportation Needs: Not on file  Physical Activity: Not on file  Stress: Not on file  Social Connections: Not on file  Intimate Partner Violence: Not on file    Review of Systems: See HPI, otherwise negative ROS  Physical Exam: Vital signs in last 24 hours: Temp:  [98.2 F (36.8 C)-98.5 F (36.9 C)] 98.2 F (36.8 C) (08/06 0900) Pulse Rate:  [68-90] 68 (08/06 0900) Resp:  [16-18] 16 (08/06 0900) BP: (125-128)/(88-89) 125/89 (08/06 0900) SpO2:  [98 %] 98 % (08/06 0900) Weight:  [105.7 kg] 105.7 kg (08/05 1435)   General:   Alert,  Well-developed, well-nourished, pleasant and cooperative in NAD Head:  Normocephalic and atraumatic. Eyes:  Sclera clear, no icterus.   Conjunctiva pink. Ears:  Normal auditory acuity. Nose:  No deformity, discharge,  or lesions. Msk:  Symmetrical  without gross deformities. Normal posture. Extremities:  Without clubbing or edema. Neurologic:  Alert and  oriented x4;  grossly normal neurologically. Skin:  Intact without significant lesions or rashes. Psych:  Alert and cooperative. Normal mood and affect.  Impression/Plan: Juan Salazar is here for a colonoscopy to be performed for chronic diarrhea, elevated CRP/ESR  The risks of the procedure including infection, bleed, or perforation as well as benefits, limitations, alternatives and imponderables have been reviewed with the patient. Questions have been answered. All parties agreeable.

## 2023-01-24 NOTE — Progress Notes (Signed)
Please excuse Daric Paslay from work on 01/24/2023. He had a medical procedure requiring anesthesia.  He may return to work after 11am on 01/25/2023.  Thank you Virgie Dad RN [facility]

## 2023-01-24 NOTE — Op Note (Signed)
[facility] Patient Name: Juan Salazar Procedure Date: 01/24/2023 9:31 AM MRN: [id number] Date of Birth: 06/20/1981 Attending MD: Hennie Duos. Marletta Lor , [place], [id number] CSN: [id number] Age: 42 Admit Type: Outpatient Procedure:                Colonoscopy Indications:              Lower abdominal pain, Chronic diarrhea Providers:                Hennie Duos. Marletta Lor, DO, Crystal Page, Lennice Sites                            Technician, Technician Referring MD:              Medicines:                See the Anesthesia note for documentation of the                            administered medications Complications:            No immediate complications. Estimated Blood Loss:     Estimated blood loss was minimal. Procedure:                Pre-Anesthesia Assessment:                           - The anesthesia plan was to use monitored                            anesthesia care (MAC).                           After obtaining informed consent, the colonoscope                            was passed under direct vision. Throughout the                            procedure, the patient's blood pressure, pulse, and                            oxygen saturations were monitored continuously. The                            [id number]-HQ190L ([id number]) scope was introduced through                            the anus and advanced to the the terminal ileum,                            with identification of the appendiceal orifice and                            IC valve. The colonoscopy was performed without                            difficulty. The patient tolerated  the procedure                            well. The quality of the bowel preparation was                            evaluated using the BBPS [facility] Bowel Preparation                            Scale) with scores of: Right Colon = 3, Transverse                            Colon = 3 and Left Colon = 3 (entire mucosa seen                            well with  no residual staining, small fragments of                            stool or opaque liquid). The total BBPS score                            equals 9. Scope In: 9:43:34 AM Scope Out: 10:10:17 AM Scope Withdrawal Time: 0 hours 24 minutes 2 seconds  Total Procedure Duration: 0 hours 26 minutes 43 seconds  Findings:      Diffuse severe inflammation characterized by erosions, erythema,       friability, loss of vascularity and shallow ulcerations was found in the       entire colon. Segmental biopsies were taken with a cold forceps for       histology.      The terminal ileum appeared normal. Biopsies were taken with a cold       forceps for histology.      Two sessile polyps were found in the transverse colon. The polyps were 6       to 8 mm in size. These polyps were removed with a cold snare. Resection       and retrieval were complete.      Numerous sessile polyps were found in the descending colon. The polyps       were 4 to 8 mm in size. ?inflammatory. 4 largest ones removed for       sampling purposes. These polyps were removed with a cold snare.       Resection and retrieval were complete.      A 20 mm polyp was found in the descending colon. The polyp was       pedunculated. The polyp was removed with a hot snare. Resection and       retrieval were complete. Impression:               - Diffuse severe inflammation was found in the                            entire examined colon secondary to pancolitis                            ulcerative colitis. Biopsied.                           -  The examined portion of the ileum was normal.                            Biopsied.                           - Two 6 to 8 mm polyps in the transverse colon,                            removed with a cold snare. Resected and retrieved.                           - Four 4 to 8 mm polyps in the descending colon,                            removed with a cold snare. Resected and retrieved.                            - One 20 mm polyp in the descending colon, removed                            with a hot snare. Resected and retrieved. Moderate Sedation:      Per Anesthesia Care Recommendation:           - Patient has a contact number available for                            emergencies. The signs and symptoms of potential                            delayed complications were discussed with the                            patient. Return to normal activities tomorrow.                            Written discharge instructions were provided to the                            patient.                           - Resume previous diet.                           - Continue present medications.                           - Await pathology results.                           - Repeat colonoscopy date to be determined after                            pending pathology results are reviewed for  surveillance.                           - Return to GI clinic in 4 weeks.                           - Findings concerning for moderate to severe UC.                            Further recommendations after pathology reviewed. Procedure Code(s):        --- Professional ---                           [phone], Colonoscopy, flexible; with removal of                            tumor(s), polyp(s), or other lesion(s) by snare                            technique                           45380, 59, Colonoscopy, flexible; with biopsy,                            single or multiple Diagnosis Code(s):        --- Professional ---                           K51.00, Ulcerative (chronic) pancolitis without                            complications                           D12.3, Benign neoplasm of transverse colon (hepatic                            flexure or splenic flexure)                           D12.4, Benign neoplasm of descending colon                           R10.30, Lower abdominal pain,  unspecified                           K52.9, Noninfective gastroenteritis and colitis,                            unspecified CPT copyright 2022 American Medical Association. All rights reserved. The codes documented in this report are preliminary and upon coder review may  be revised to meet current compliance requirements. Hennie Duos. Marletta Lor, DO Hennie Duos. Callahan Peddie, DO 01/24/2023 10:16:44 AM This report has been signed electronically. Number of Addenda: 0

## 2023-01-24 NOTE — Transfer of Care (Signed)
Immediate Anesthesia Transfer of Care Note  Patient: Juan Salazar  Procedure(s) Performed: COLONOSCOPY WITH PROPOFOL BIOPSY POLYPECTOMY INTESTINAL  Patient Location: PACU  Anesthesia Type:General  Level of Consciousness: awake  Airway & Oxygen Therapy: Patient Spontanous Breathing  Post-op Assessment: Report given to RN  Post vital signs: Reviewed and stable  Last Vitals:  Vitals Value Taken Time  BP    Temp    Pulse    Resp    SpO2      Last Pain:  Vitals:   01/24/23 0940  TempSrc:   PainSc: 0-No pain         Complications: No notable events documented.

## 2023-01-26 ENCOUNTER — Encounter (HOSPITAL_COMMUNITY): Payer: Self-pay | Admitting: Internal Medicine

## 2023-01-27 ENCOUNTER — Telehealth: Payer: Self-pay | Admitting: Internal Medicine

## 2023-01-27 ENCOUNTER — Ambulatory Visit: Payer: Medicaid Other | Admitting: Internal Medicine

## 2023-01-27 DIAGNOSIS — K529 Noninfective gastroenteritis and colitis, unspecified: Secondary | ICD-10-CM

## 2023-01-27 NOTE — Telephone Encounter (Signed)
Please let patient know that all of his biopsies showed active chronic colitis, small bowel biopsies also showed active ileitis.  Fortunately his polyps were benign, inflammatory.  We need to start the process in getting him on treatment for his underlying inflammatory bowel disease.  Unclear if he has ulcerative colitis with backwash ileitis versus Crohn's disease.  I am going to order blood work at [facility] that needs to be completed.  I am also going to order a baseline fecal calprotectin.

## 2023-01-27 NOTE — Telephone Encounter (Signed)
Phoned and advised the pt of his result note and that blood work has been ordered at [facility] for the pt to complete. I advised the pt once his labs are done Dr Marletta Lor can best decide what is best for the pt's treatment. Pt expressed understanding

## 2023-01-31 DIAGNOSIS — K529 Noninfective gastroenteritis and colitis, unspecified: Secondary | ICD-10-CM | POA: Diagnosis not present

## 2023-02-01 ENCOUNTER — Other Ambulatory Visit: Payer: Self-pay

## 2023-02-01 DIAGNOSIS — K529 Noninfective gastroenteritis and colitis, unspecified: Secondary | ICD-10-CM

## 2023-02-01 NOTE — Telephone Encounter (Signed)
Pt's wife called again today stating that [facility] has already given her husband (the pt) the kit to check his stools but we need a order put in before he can take it back. Please send order to [facility]. (Pt was advised that whenever the test you ordered was done, stool specimens are also done). This is regarding the H Pylori to make sure it is done from the notes I read. Please advise

## 2023-02-02 DIAGNOSIS — K529 Noninfective gastroenteritis and colitis, unspecified: Secondary | ICD-10-CM | POA: Diagnosis not present

## 2023-02-02 NOTE — Telephone Encounter (Signed)
Order for fecal calprotectin has been changed.

## 2023-02-07 LAB — CALPROTECTIN, FECAL: Calprotectin, Fecal: 1560 ug/g — ABNORMAL HIGH (ref 0–120)

## 2023-02-13 ENCOUNTER — Telehealth: Payer: Self-pay

## 2023-02-13 NOTE — Telephone Encounter (Signed)
Pt called needing to reschedule his appt

## 2023-02-24 ENCOUNTER — Ambulatory Visit: Payer: Medicaid Other | Admitting: Gastroenterology

## 2023-03-05 NOTE — Progress Notes (Signed)
GI Office Note    Referring Provider: Billie Lade, MD Primary Care Physician:  Billie Lade, MD  Primary Gastroenterologist: Hennie Duos. Marletta Lor, DO   Chief Complaint   Chief Complaint  Patient presents with   Follow-up    Still having issues with diarrhea depending on what he eats    History of Present Illness   Juan Salazar is a 42 y.o. male presenting today for follow up chronic diarrhea. We initially saw him 10/24/22. He completed extensive labs and advised colonoscopy which unfortunately was rescheduled from June to July and then to August upon patient request.   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  Patient was advised to have hepatitis A vaccination. Per Dr. 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  Consider anti-TNF such as Humira or Remicade.  Today: overall doing ok. Diarrhea worse with eating red meat, dairy, sweets. Sometimes has nocturnal diarrhea depending on what he eats at night. No melena, brbpr. No abdominal pain. No n/v. Sometimes has right ankle pain, which he contributes to multiple sprains over the years. Denies other joint pains. He has occasional heartburn with certain foods. No dysphagia. No n/v.   He has psoriasis involving the elbows/knees/scalp. He has history of athlete's feet and nail fungus.   Colonoscopy 01/2023: -diffuse severe inflammation in entire colon secondary to pancolitis UC s/p bx -examined portion of ileum appeared normal s/p biopsy -two 6-85mm polyps in descending colon  -one 20mm polyp in descending colon FINAL MICROSCOPIC  DIAGNOSIS:  A. TERMINAL ILEUM, BIOPSY: Mild chronic, focally active ileitis with single poorly formed granuloma Negative for dysplasia  B. CECUM AND ASCENDING COLON, BIOPSY: Marked chronic active colitis with crypt abscesses Negative for dysplasia and granulomas  C. TRANSVERSE COLON, BIOPSY: Marked chronic active colitis with surface erosion Negative for dysplasia and granulomas  D. TRANSVERSE COLON, POLYPECTOMY: Benign inflammatory/granulation tissue polyp Negative for dysplasia and carcinoma  E. DESCENDING COLON, POLYPECTOMY: Benign inflammatory/granulation tissue polyp Negative for dysplasia and carcinoma  F. DESCENDING COLON, LARGE POLYP, POLYPECTOMY: Benign inflammatory/granulation tissue polyp Negative for dysplasia and carcinoma  G. SIGMOID COLON AND RECTUM, BIOPSY: Marked chronic active colitis with surface erosion Negative for dysplasia and granulomas  These biopsies show a marked chronic active pancolitis with associated  inflammatory pseudopolyps and chronic active ileitis showing a poorly  formed granuloma.  Overall, these features are consistent with  idiopathic inflammatory bowel disease. The ileitis may represent Crohn's  ileitis or backwash ileitis.   Medications   Current Outpatient Medications  Medication Sig Dispense Refill   azelastine (ASTELIN) 0.1 % nasal spray Place 1 spray into both nostrils 2 (two) times daily as needed. Use in each nostril as directed 30 mL 5   cetirizine (ZYRTEC ALLERGY) 10 MG tablet Take 1 tablet (10 mg total) by mouth daily. 30 tablet 5   Cholecalciferol (VITAMIN D3) 25 MCG (1000 UT) CAPS Take 1 capsule (1,000 Units total) by mouth daily. 60 capsule 0   fluticasone (FLONASE) 50 MCG/ACT nasal spray Place 2 sprays into both nostrils daily. 16 g 5   triamcinolone cream (KENALOG) 0.1 % Apply 1 Application topically 2 (two) times daily. 30 g 0  No current facility-administered medications for this visit.    Allergies    Allergies as of 03/06/2023   (No Known Allergies)    Review of Systems   General: Negative for anorexia, weight loss, fever, chills, fatigue, weakness. ENT: Negative for hoarseness, difficulty swallowing , nasal congestion. CV: Negative for chest pain, angina, palpitations, dyspnea on exertion, peripheral edema.  Respiratory: Negative for dyspnea at rest, dyspnea on exertion, cough, sputum, wheezing.  GI: See history of present illness. GU:  Negative for dysuria, hematuria, urinary incontinence, urinary frequency, nocturnal urination.  Endo: Negative for unusual weight change.     Physical Exam   BP 117/77 (BP Location: Right Arm, Patient Position: Sitting, Cuff Size: Large)   Pulse 82   Temp 98 F (36.7 C) (Oral)   Ht 5\' 9"  (1.753 m)   Wt 237 lb 12.8 oz (107.9 kg)   SpO2 97%   BMI 35.12 kg/m    General: Well-nourished, well-developed in no acute distress.  Eyes: No icterus. Mouth: Oropharyngeal mucosa moist and pink ,  Lungs: Clear to auscultation bilaterally.  Heart: Regular rate and rhythm, no murmurs rubs or gallops.  Abdomen: Bowel sounds are normal, nontender, nondistended, no hepatosplenomegaly or masses,  no abdominal bruits or hernia , no rebound or guarding.  Rectal: not performed  Extremities: No lower extremity edema. No clubbing or deformities. Neuro: Alert and oriented x 4   Skin: Warm and dry, no jaundice.   Psych: Alert and cooperative, normal mood and affect.  Labs   See hpi  Imaging Studies   No results found.  Assessment   *UC, pancolitis with backwash ileitis  Recent diagnosis 01/2023. Recommend biologic, consider Humira if on patient's formulary. Will also treat his psoriasis. He has completed TB and Hep B testing, immune to Hep B. Immune to varicella zoster. Clinically patient has been stable.    PLAN   Consider Humira. We will start approval process.    Leanna Battles. Melvyn Neth, MHS, PA-C [facility]

## 2023-03-06 ENCOUNTER — Encounter: Payer: Self-pay | Admitting: Gastroenterology

## 2023-03-06 ENCOUNTER — Ambulatory Visit (INDEPENDENT_AMBULATORY_CARE_PROVIDER_SITE_OTHER): Payer: Medicaid Other | Admitting: Gastroenterology

## 2023-03-06 VITALS — BP 117/77 | HR 82 | Temp 98.0°F | Ht 69.0 in | Wt 237.8 lb

## 2023-03-06 DIAGNOSIS — K5100 Ulcerative (chronic) pancolitis without complications: Secondary | ICD-10-CM | POA: Diagnosis not present

## 2023-03-06 DIAGNOSIS — K5190 Ulcerative colitis, unspecified, without complications: Secondary | ICD-10-CM | POA: Insufficient documentation

## 2023-03-06 NOTE — Patient Instructions (Addendum)
We will be in touch in the next couple of days with medication choice for ulcerative colitis. We will need to see what is on your formulary for your insurance and that could potentially help psoriasis at the same time.

## 2023-03-14 ENCOUNTER — Telehealth: Payer: Self-pay

## 2023-03-14 NOTE — Telephone Encounter (Signed)
Pt called wanting to know if you have decided what medication would be sent in for him to start taking.

## 2023-03-15 ENCOUNTER — Other Ambulatory Visit: Payer: Self-pay | Admitting: Gastroenterology

## 2023-03-15 ENCOUNTER — Other Ambulatory Visit: Payer: Self-pay

## 2023-03-15 MED ORDER — HUMIRA (2 SYRINGE) 40 MG/0.4ML ~~LOC~~ PSKT: PREFILLED_SYRINGE | SUBCUTANEOUS | 11 refills | Status: DC

## 2023-03-15 MED ORDER — HUMIRA-CD/UC/HS STARTER 80 MG/0.8ML ~~LOC~~ AJKT: AUTO-INJECTOR | SUBCUTANEOUS | 0 refills | Status: DC

## 2023-03-15 NOTE — Telephone Encounter (Signed)
Tammy, it looks like Humira would be a good choice. Appears to be on his formulary. Would treat both UC and psoriasis.  Let's move forward.

## 2023-03-16 NOTE — Telephone Encounter (Signed)
Pt was made aware and verbalized understanding. Progress note, insurance card and tb gold test have been faxed to [employer] for approval.

## 2023-03-17 NOTE — Telephone Encounter (Signed)
Thank you :)

## 2023-03-17 NOTE — Telephone Encounter (Signed)
Todd from [employer] sent an email stating that all information has been received and that they are processing the pt's referral. PA for medication has been approved. [employer] will keep [place] updated.

## 2023-03-20 NOTE — Telephone Encounter (Signed)
Pt has been approved with a $4 copay, [employer] is arranging shipping.

## 2023-03-23 NOTE — Telephone Encounter (Signed)
OK let's make sure he is enrolled in the [employer] program to provide patient support.   Let's also make sure he has ov six weeks after he takes his first dose.  Make sure to see if he needs injection training.

## 2023-03-23 NOTE — Telephone Encounter (Signed)
Lmom for pt to return call. 

## 2023-03-27 ENCOUNTER — Ambulatory Visit: Payer: Medicaid Other | Admitting: Internal Medicine

## 2023-03-28 ENCOUNTER — Ambulatory Visit: Payer: Medicaid Other | Admitting: Internal Medicine

## 2023-03-29 NOTE — Telephone Encounter (Signed)
noted 

## 2023-03-29 NOTE — Telephone Encounter (Signed)
Spoke with pt and he stated that he doesn't have an Clinical cytogeneticist but that he has started his injections and that he was able to watch videos and has figured out how to give himself the injections. Pt was scheduled a f/u visit.

## 2023-04-19 ENCOUNTER — Telehealth: Payer: Self-pay

## 2023-04-19 MED ORDER — HUMIRA (2 PEN) 40 MG/0.4ML ~~LOC~~ AJKT: 40.0000 mg | AUTO-INJECTOR | SUBCUTANEOUS | 11 refills | Status: DC

## 2023-04-19 NOTE — Telephone Encounter (Signed)
RX for pens sent.

## 2023-04-19 NOTE — Addendum Note (Signed)
Addended by: Tiffany Kocher on: 04/19/2023 08:16 PM   Modules accepted: Orders

## 2023-04-19 NOTE — Telephone Encounter (Signed)
[employer] called and they are requesting that the humira pens for the maintenance dose be called in. Pt is not wanting syringes, he is wanting the pens.

## 2023-05-01 ENCOUNTER — Ambulatory Visit: Payer: Medicaid Other | Admitting: Gastroenterology

## 2023-05-01 NOTE — Progress Notes (Deleted)
GI Office Note    Referring Provider: Billie Lade, MD Primary Care Physician:  Billie Lade, MD  Primary Gastroenterologist: Hennie Duos. Marletta Lor, DO   Chief Complaint   No chief complaint on file.   History of Present Illness   Juan Salazar is a 42 y.o. male presenting today for follow up. Last seen in 02/2023.     Vaccines:***   Colonoscopy 01/2023: -diffuse severe inflammation in entire colon secondary to pancolitis UC s/p bx -examined portion of ileum appeared normal s/p biopsy -two 6-73mm polyps in descending colon  -one 20mm polyp in descending colon FINAL MICROSCOPIC DIAGNOSIS:  A. TERMINAL ILEUM, BIOPSY: Mild chronic, focally active ileitis with single poorly formed granuloma Negative for dysplasia  B. CECUM AND ASCENDING COLON, BIOPSY: Marked chronic active colitis with crypt abscesses Negative for dysplasia and granulomas  C. TRANSVERSE COLON, BIOPSY: Marked chronic active colitis with surface erosion Negative for dysplasia and granulomas  D. TRANSVERSE COLON, POLYPECTOMY: Benign inflammatory/granulation tissue polyp Negative for dysplasia and carcinoma  E. DESCENDING COLON, POLYPECTOMY: Benign inflammatory/granulation tissue polyp Negative for dysplasia and carcinoma  F. DESCENDING COLON, LARGE POLYP, POLYPECTOMY: Benign inflammatory/granulation tissue polyp Negative for dysplasia and carcinoma  G. SIGMOID COLON AND RECTUM, BIOPSY: Marked chronic active colitis with surface erosion Negative for dysplasia and granulomas   These biopsies show a marked chronic active pancolitis with associated  inflammatory pseudopolyps and chronic active ileitis showing a poorly  formed granuloma.  Overall, these features are consistent with  idiopathic inflammatory bowel disease. The ileitis may represent Crohn's  ileitis or backwash ileitis.       Medications   Current Outpatient Medications  Medication Sig Dispense Refill   adalimumab  (HUMIRA, 2 PEN,) 40 MG/0.4ML pen Inject 0.4 mLs (40 mg total) into the skin every 14 (fourteen) days. 2 each 11   adalimumab (HUMIRA-CD/UC/HS STARTER) 80 MG/0.8ML pen Inject 80mg  subcutaneously on day 1, day 2, and day 15. The Humira 40mg  every 2 weeks, starting on day 29. See other prescription. 3 each 0   azelastine (ASTELIN) 0.1 % nasal spray Place 1 spray into both nostrils 2 (two) times daily as needed. Use in each nostril as directed 30 mL 5   cetirizine (ZYRTEC ALLERGY) 10 MG tablet Take 1 tablet (10 mg total) by mouth daily. 30 tablet 5   Cholecalciferol (VITAMIN D3) 25 MCG (1000 UT) CAPS Take 1 capsule (1,000 Units total) by mouth daily. 60 capsule 0   fluticasone (FLONASE) 50 MCG/ACT nasal spray Place 2 sprays into both nostrils daily. 16 g 5   triamcinolone cream (KENALOG) 0.1 % Apply 1 Application topically 2 (two) times daily. 30 g 0   No current facility-administered medications for this visit.    Allergies   Allergies as of 05/01/2023   (No Known Allergies)     Past Medical History   Past Medical History:  Diagnosis Date   Psoriasis     Past Surgical History   Past Surgical History:  Procedure Laterality Date   BIOPSY  01/24/2023   Procedure: BIOPSY;  Surgeon: Lanelle Bal, DO;  Location: [facility];  Service: Endoscopy;;   COLONOSCOPY WITH PROPOFOL N/A 01/24/2023   Procedure: COLONOSCOPY WITH PROPOFOL;  Surgeon: Lanelle Bal, DO;  Location: [facility];  Service: Endoscopy;  Laterality: N/A;  10:15am, asa 2   none     POLYPECTOMY  01/24/2023   Procedure: POLYPECTOMY INTESTINAL;  Surgeon: Lanelle Bal, DO;  Location: [facility];  Service: Endoscopy;;    Past Family History   Family History  Problem Relation Age of Onset   Hyperlipidemia Mother    Heart disease Mother    Heart disease Father    Psychosis Father    Psychosis Brother    Colon cancer Neg Hx    Celiac disease Neg Hx    Inflammatory bowel disease Neg Hx     Past Social  History   Social History   Socioeconomic History   Marital status: Married    Spouse name: Not on file   Number of children: Not on file   Years of education: Not on file   Highest education level: Not on file  Occupational History   Not on file  Tobacco Use   Smoking status: Never    Passive exposure: Current   Smokeless tobacco: Never  Vaping Use   Vaping status: Never Used  Substance and Sexual Activity   Alcohol use: Not Currently    Comment: rarely   Drug use: No   Sexual activity: Yes  Other Topics Concern   Not on file  Social History Narrative   ** Merged History Encounter **       Social Determinants of Health   Financial Resource Strain: Not on file  Food Insecurity: Not on file  Transportation Needs: Not on file  Physical Activity: Not on file  Stress: Not on file  Social Connections: Not on file  Intimate Partner Violence: Not on file    Review of Systems   General: Negative for anorexia, weight loss, fever, chills, fatigue, weakness. ENT: Negative for hoarseness, difficulty swallowing , nasal congestion. CV: Negative for chest pain, angina, palpitations, dyspnea on exertion, peripheral edema.  Respiratory: Negative for dyspnea at rest, dyspnea on exertion, cough, sputum, wheezing.  GI: See history of present illness. GU:  Negative for dysuria, hematuria, urinary incontinence, urinary frequency, nocturnal urination.  Endo: Negative for unusual weight change.     Physical Exam   There were no vitals taken for this visit.   General: Well-nourished, well-developed in no acute distress.  Eyes: No icterus. Mouth: Oropharyngeal mucosa moist and pink , no lesions erythema or exudate. Lungs: Clear to auscultation bilaterally.  Heart: Regular rate and rhythm, no murmurs rubs or gallops.  Abdomen: Bowel sounds are normal, nontender, nondistended, no hepatosplenomegaly or masses,  no abdominal bruits or hernia , no rebound or guarding.  Rectal: ***   Extremities: No lower extremity edema. No clubbing or deformities. Neuro: Alert and oriented x 4   Skin: Warm and dry, no jaundice.   Psych: Alert and cooperative, normal mood and affect.  Labs   *** Imaging Studies   No results found.  Assessment       PLAN   ***   Leanna Battles. Melvyn Neth, MHS, PA-C [facility]

## 2023-05-04 ENCOUNTER — Ambulatory Visit: Payer: Medicaid Other | Admitting: Internal Medicine

## 2023-05-23 ENCOUNTER — Ambulatory Visit: Payer: Self-pay | Admitting: Physician Assistant

## 2023-05-26 ENCOUNTER — Encounter: Payer: Self-pay | Admitting: Family Medicine

## 2023-05-26 ENCOUNTER — Ambulatory Visit: Payer: Medicaid Other | Admitting: Family Medicine

## 2023-05-26 ENCOUNTER — Telehealth: Payer: Self-pay

## 2023-05-26 ENCOUNTER — Telehealth: Payer: Self-pay | Admitting: Family Medicine

## 2023-05-26 ENCOUNTER — Other Ambulatory Visit: Payer: Self-pay | Admitting: *Deleted

## 2023-05-26 ENCOUNTER — Other Ambulatory Visit: Payer: Self-pay

## 2023-05-26 VITALS — BP 122/72 | HR 79 | Temp 98.1°F | Ht 69.29 in | Wt 243.0 lb

## 2023-05-26 DIAGNOSIS — K529 Noninfective gastroenteritis and colitis, unspecified: Secondary | ICD-10-CM

## 2023-05-26 DIAGNOSIS — J301 Allergic rhinitis due to pollen: Secondary | ICD-10-CM

## 2023-05-26 DIAGNOSIS — J3089 Other allergic rhinitis: Secondary | ICD-10-CM

## 2023-05-26 MED ORDER — CARBINOXAMINE MALEATE 4 MG PO TABS: ORAL_TABLET | ORAL | 3 refills | Status: DC

## 2023-05-26 NOTE — Patient Instructions (Signed)
Allergic rhinitis Continue allergen avoidance measures directed toward mold and grass as listed below Begin carbinoxamine 4 mg tablets. Take 1-2 tablets in the morning and 1-2 tablets in the evening as needed for nasal symptoms. This will replace cetirizine Continue Flonase 2 sprays in each nostril once a day as needed for stuffy nose Continue azelastine 2 sprays in each nostril up to twice a day as needed for nasal symptoms Consider saline nasal rinses as needed for nasal symptoms. Use this before any medicated nasal sprays for best result Consider intradermal allergy testing to complete the testing procedure.  If interested, stop antihistamines 3 days before your testing appointment A lab test has been ordered to help [place] evaluate your environmental allergies. We will call you when the results become available. Information provided for allergen immunotherapy  Call the clinic if this treatment plan is not working well for you.  Follow up in 6 months or sooner if needed.  Reducing Pollen Exposure The American Academy of Allergy, Asthma and Immunology suggests the following steps to reduce your exposure to pollen during allergy seasons. Do not hang sheets or clothing out to dry; pollen may collect on these items. Do not mow lawns or spend time around freshly cut grass; mowing stirs up pollen. Keep windows closed at night.  Keep car windows closed while driving. Minimize morning activities outdoors, a time when pollen counts are usually at their highest. Stay indoors as much as possible when pollen counts or humidity is high and on windy days when pollen tends to remain in the air longer. Use air conditioning when possible.  Many air conditioners have filters that trap the pollen spores. Use a HEPA room air filter to remove pollen form the indoor air you breathe.  Control of Mold Allergen Mold and fungi can grow on a variety of surfaces provided certain temperature and moisture conditions exist.   Outdoor molds grow on plants, decaying vegetation and soil.  The major outdoor mold, Alternaria and Cladosporium, are found in very high numbers during hot and dry conditions.  Generally, a late Summer - Fall peak is seen for common outdoor fungal spores.  Rain will temporarily lower outdoor mold spore count, but counts rise rapidly when the rainy period ends.  The most important indoor molds are Aspergillus and Penicillium.  Dark, humid and poorly ventilated basements are ideal sites for mold growth.  The next most common sites of mold growth are the bathroom and the kitchen.  Outdoor [employer] Use air conditioning and keep windows closed Avoid exposure to decaying vegetation. Avoid leaf raking. Avoid grain handling. Consider wearing a face mask if working in moldy areas.  Indoor Mold Control Maintain humidity below 50%. Clean washable surfaces with 5% bleach solution. Remove sources e.g. Contaminated carpets.

## 2023-05-26 NOTE — Telephone Encounter (Signed)
*  Asthma/Allergy  Pharmacy Patient Advocate Encounter  Received notification from [facility] that Prior Authorization for Carbinoxamine Maleate 4MG  tablets  has been APPROVED from 05/26/2023 to 05/24/2024   PA #/Case ID/Reference #: [employer]

## 2023-05-26 NOTE — Telephone Encounter (Signed)
Prescription has been sent in to requested pharmacy. Called and left a voicemail asking for a return call to inform.

## 2023-05-26 NOTE — Telephone Encounter (Signed)
Patient's spouse called stating [facility] could not fill the Carbinoxamine until Wednesday. Patient's spouse wants [place] to send the medication to [facility] on [address].

## 2023-05-26 NOTE — Telephone Encounter (Signed)
ERROR

## 2023-05-26 NOTE — Progress Notes (Signed)
[address] [place]  [place] [address] Dept: [phone]  FOLLOW UP NOTE  Patient ID: JABRE WASCO, male    DOB: 05-Aug-1980  Age: 42 y.o. MRN: [id number] Date of Office Visit: 05/26/2023  Assessment  Chief Complaint: Follow-up  HPI Juan Salazar is a 42 year old male who presents to the clinic for follow-up visit.  He was last seen in this clinic on 01/23/2023 by Dr. Allena Katz as a new patient for evaluation of asthma, allergic rhinitis, and diarrhea possibly related to food allergy.  He is accompanied by his wife who assists with history.    At today's visit, he reports allergic rhinitis has been only moderately well-controlled with symptoms including occasional nasal congestion and constant postnasal drainage with frequent throat clearing.  He works in Administrator, sports and reports that when he is underneath the house he begins to experience nasal drainage and postnasal drainage.  When he is not at work, the symptoms are less bothersome.  He continues cetirizine about 3 days a week and infrequently uses Flonase or azelastine.  He reports cetirizine has been ineffective for relieving his allergy symptoms.  His last environmental allergy skin testing was on 01/23/2023 was positive to molds and grasses.  He did not have intradermal testing performed at that time due to an upcoming surgery.  He is not interested in intradermal testing at today's visit, however, will consider lab testing and possibly allergen immunotherapy if his symptoms do not improve with the new medication regimen.   He is not currently avoiding any foods at this time, however, he does try to limit dairy consumption due to abdominal issues likely related to ulcerative colitis.  He continues to follow-up with his gastrointestinal specialist at [facility].  He continues Humira.  Last food allergy testing to the most common allergenic foods was negative on 01/23/2023.  His current  medications are listed in the chart.  Drug Allergies:  No Known Allergies  Physical Exam: BP 122/72   Pulse 79   Temp 98.1 F (36.7 C)   Ht 5' 9.29" (1.76 m)   Wt 243 lb (110.2 kg)   SpO2 98%   BMI 35.58 kg/m    Physical Exam Vitals reviewed.  Constitutional:      Appearance: Normal appearance.  HENT:     Head: Normocephalic and atraumatic.     Right Ear: Tympanic membrane normal.     Left Ear: Tympanic membrane normal.     Nose:     Comments: Bilateral nares slightly erythematous with thin clear nasal drainage noted.  Pharynx normal.  Ears normal.  Eyes normal.    Mouth/Throat:     Pharynx: Oropharynx is clear.  Eyes:     Conjunctiva/sclera: Conjunctivae normal.  Cardiovascular:     Rate and Rhythm: Normal rate and regular rhythm.     Heart sounds: Normal heart sounds. No murmur heard. Pulmonary:     Effort: Pulmonary effort is normal.     Breath sounds: Normal breath sounds.     Comments: Lungs clear to auscultation Musculoskeletal:        General: Normal range of motion.     Cervical back: Normal range of motion and neck supple.  Skin:    General: Skin is warm and dry.  Neurological:     Mental Status: He is alert and oriented to person, place, and time.  Psychiatric:        Mood and Affect: Mood normal.  Behavior: Behavior normal.        Thought Content: Thought content normal.        Judgment: Judgment normal.     Assessment and Plan: 1. Seasonal allergic rhinitis due to pollen   2. Allergic rhinitis caused by mold   3. Chronic diarrhea     Meds ordered this encounter  Medications   Carbinoxamine Maleate 4 MG TABS    Sig: Take 1 to 2 tablets in the morning and 1 to 2 tablets in the evening if needed for nasal symptoms    Dispense:  120 tablet    Refill:  3    Patient Instructions  Allergic rhinitis Continue allergen avoidance measures directed toward mold and grass as listed below Begin carbinoxamine 4 mg tablets. Take 1-2 tablets in the  morning and 1-2 tablets in the evening as needed for nasal symptoms. This will replace cetirizine Continue Flonase 2 sprays in each nostril once a day as needed for stuffy nose Continue azelastine 2 sprays in each nostril up to twice a day as needed for nasal symptoms Consider saline nasal rinses as needed for nasal symptoms. Use this before any medicated nasal sprays for best result Consider intradermal allergy testing to complete the testing procedure.  If interested, stop antihistamines 3 days before your testing appointment A lab test has been ordered to help [place] evaluate your environmental allergies. We will call you when the results become available. Information provided for allergen immunotherapy  Call the clinic if this treatment plan is not working well for you.  Follow up in 6 months or sooner if needed.   Return in about 6 months (around 11/24/2023), or if symptoms worsen or fail to improve.    Thank you for the opportunity to care for this patient.  Please do not hesitate to contact me with questions.  Thermon Leyland, FNP [facility]

## 2023-05-29 ENCOUNTER — Ambulatory Visit (INDEPENDENT_AMBULATORY_CARE_PROVIDER_SITE_OTHER): Payer: Medicaid Other | Admitting: Gastroenterology

## 2023-05-29 ENCOUNTER — Encounter: Payer: Self-pay | Admitting: Gastroenterology

## 2023-05-29 VITALS — BP 114/79 | HR 76 | Temp 98.4°F | Ht 69.0 in | Wt 242.0 lb

## 2023-05-29 DIAGNOSIS — K5100 Ulcerative (chronic) pancolitis without complications: Secondary | ICD-10-CM

## 2023-05-29 DIAGNOSIS — K5190 Ulcerative colitis, unspecified, without complications: Secondary | ICD-10-CM | POA: Insufficient documentation

## 2023-05-29 MED ORDER — PREDNISONE 10 MG PO TABS: ORAL_TABLET | ORAL | 0 refills | Status: DC

## 2023-05-29 NOTE — Patient Instructions (Signed)
Continue Humira every 14 days. Start prednisone to try and get you into remission (for your ulcerative colitis). Take 30mg  daily for 5 days, then 20mg  daily for 7 days, then 10mg  daily for 7 days, then 5mg  daily for 7 days.  Please get your labs done Tuesday or Wednesday of this week to get drug level and check inflammatory markers.  We will be in touch with results as available.

## 2023-05-29 NOTE — Progress Notes (Signed)
GI Office Note    Referring Provider: Billie Lade, MD Primary Care Physician:  Billie Lade, MD  Primary Gastroenterologist: Hennie Duos. Marletta Lor, DO   Chief Complaint   Chief Complaint  Patient presents with   Follow-up    Follow up on chronic diarrhea, and UC. Pt has not seen any change    History of Present Illness   Juan Salazar is a 42 y.o. male presenting today for follow-up.  He was last seen in September 2024.  Patient diagnosed with ulcerative colitis back in August.  He also has a history of psoriasis.  He was started on Humira.  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    Today: BM 5-7, all loose, at least 2 during the night. Does not notice any improvement. No blood in the stool. Some bloating and abdominal pain, especially if he is not able to get to the bathroom and delays BMs. No n/v. No heartburn unless eat spicy foods. Psoriasis about the same. Some lower back pain, notes it when lays in bed.  Colonoscopy 01/2023: -diffuse severe inflammation in entire colon secondary to pancolitis UC s/p bx -examined portion of ileum appeared normal s/p biopsy -two 6-58mm polyps in descending colon  -one 20mm polyp in descending colon FINAL MICROSCOPIC DIAGNOSIS:  A. TERMINAL ILEUM, BIOPSY: Mild chronic, focally active ileitis with single poorly formed granuloma Negative for dysplasia  B. CECUM AND ASCENDING COLON, BIOPSY: Marked chronic active colitis with crypt abscesses Negative for dysplasia and granulomas  C. TRANSVERSE COLON, BIOPSY: Marked chronic active colitis with surface erosion Negative  for dysplasia and granulomas  D. TRANSVERSE COLON, POLYPECTOMY: Benign inflammatory/granulation tissue polyp Negative for dysplasia and carcinoma  E. DESCENDING COLON, POLYPECTOMY: Benign inflammatory/granulation tissue polyp Negative for dysplasia and carcinoma  F. DESCENDING COLON, LARGE POLYP, POLYPECTOMY: Benign inflammatory/granulation tissue polyp Negative for dysplasia and carcinoma  G. SIGMOID COLON AND RECTUM, BIOPSY: Marked chronic active colitis with surface erosion Negative for dysplasia and granulomas   These biopsies show a marked chronic active pancolitis with associated  inflammatory pseudopolyps and chronic active ileitis showing a poorly  formed granuloma.  Overall, these features are consistent with  idiopathic inflammatory bowel disease. The ileitis may represent Crohn's  ileitis or backwash ileitis.   Medications   Current Outpatient Medications  Medication Sig Dispense Refill   adalimumab (HUMIRA, 2 PEN,) 40 MG/0.4ML pen Inject 0.4 mLs (40 mg total) into the skin every 14 (fourteen) days. 2 each 11   adalimumab (HUMIRA-CD/UC/HS STARTER) 80 MG/0.8ML pen Inject 80mg  subcutaneously on day 1, day 2, and day 15. The Humira 40mg  every 2 weeks, starting on day 29. See other prescription. 3 each 0   azelastine (ASTELIN) 0.1 % nasal spray Place 1 spray into both nostrils 2 (two) times daily as needed. Use in each nostril as directed 30 mL 5   Cholecalciferol (VITAMIN D3) 25 MCG (1000 UT) CAPS Take 1 capsule (1,000 Units total) by mouth daily. 60 capsule 0   fluticasone (FLONASE) 50 MCG/ACT nasal spray Place 2 sprays into both nostrils daily. 16 g 5   No current facility-administered medications for this visit.    Allergies   Allergies as of  05/29/2023   (No Known Allergies)      Review of Systems   General: Negative for anorexia, weight loss, fever, chills, fatigue, weakness. ENT: Negative for hoarseness, difficulty swallowing , nasal congestion. CV:  Negative for chest pain, angina, palpitations, dyspnea on exertion, peripheral edema.  Respiratory: Negative for dyspnea at rest, dyspnea on exertion, cough, sputum, wheezing.  GI: See history of present illness. GU:  Negative for dysuria, hematuria, urinary incontinence, urinary frequency, nocturnal urination.  Endo: Negative for unusual weight change.     Physical Exam   BP 114/79   Pulse 76   Temp 98.4 F (36.9 C)   Ht 5\' 9"  (1.753 m)   Wt 242 lb (109.8 kg)   BMI 35.74 kg/m    General: Well-nourished, well-developed in no acute distress.  Eyes: No icterus. Mouth: Oropharyngeal mucosa moist and pink   Lungs: Clear to auscultation bilaterally.  Heart: Regular rate and rhythm, no murmurs rubs or gallops.  Abdomen: Bowel sounds are normal, nontender, nondistended, no hepatosplenomegaly or masses,  no abdominal bruits or hernia , no rebound or guarding.  Rectal: not performed  Extremities: No lower extremity edema. No clubbing or deformities. Neuro: Alert and oriented x 4   Skin: Warm and dry, no jaundice.   Psych: Alert and cooperative, normal mood and affect.  Labs   See hpi  Imaging Studies   No results found.  Assessment/Plan:   Ulcerative colitis: involving the entire colon. Continues to feel about the same. Has not received any steroids to date. Humira for two months.   -CBC, BMET, Sed rate, CRP, fecal calprotectine, humira levels (instructed to go Wednesday for trough level).  -prednisone 30mg  for 5 days, 20mg  for 7 days, 10mg  for 7 days, 5mg  for 7 days.     Leanna Battles. Melvyn Neth, MHS, PA-C [facility]

## 2023-05-30 NOTE — Telephone Encounter (Signed)
Called and spoke to patients wife and she expressed that they were able to get the medication.

## 2023-05-31 DIAGNOSIS — K5100 Ulcerative (chronic) pancolitis without complications: Secondary | ICD-10-CM | POA: Diagnosis not present

## 2023-05-31 DIAGNOSIS — J301 Allergic rhinitis due to pollen: Secondary | ICD-10-CM | POA: Diagnosis not present

## 2023-06-02 LAB — ALLERGENS, ZONE 2

## 2023-06-02 NOTE — Progress Notes (Signed)
Can you please let this patient know that his most recent lab work evaluating environmental allergies was negative to the panel. Please have him continue to follow the treatment plan from his last visit. Thank you

## 2023-06-11 LAB — SERIAL MONITORING

## 2023-06-12 LAB — CBC WITH DIFFERENTIAL/PLATELET
Basophils Absolute: 0 10*3/uL (ref 0.0–0.2)
Basos: 0 %
EOS (ABSOLUTE): 0.1 10*3/uL (ref 0.0–0.4)
Eos: 1 %
Hematocrit: 39.8 % (ref 37.5–51.0)
Hemoglobin: 12.8 g/dL — ABNORMAL LOW (ref 13.0–17.7)
Immature Grans (Abs): 0 10*3/uL (ref 0.0–0.1)
Immature Granulocytes: 0 %
Lymphocytes Absolute: 3 10*3/uL (ref 0.7–3.1)
Lymphs: 27 %
MCH: 26.2 pg — ABNORMAL LOW (ref 26.6–33.0)
MCHC: 32.2 g/dL (ref 31.5–35.7)
MCV: 81 fL (ref 79–97)
Monocytes Absolute: 0.7 10*3/uL (ref 0.1–0.9)
Monocytes: 6 %
Neutrophils Absolute: 7.3 10*3/uL — ABNORMAL HIGH (ref 1.4–7.0)
Neutrophils: 66 %
Platelets: 391 10*3/uL (ref 150–450)
RBC: 4.89 x10E6/uL (ref 4.14–5.80)
RDW: 15.2 % (ref 11.6–15.4)
WBC: 11.1 10*3/uL — ABNORMAL HIGH (ref 3.4–10.8)

## 2023-06-12 LAB — SEDIMENTATION RATE: Sed Rate: 42 mm/h — ABNORMAL HIGH (ref 0–15)

## 2023-06-12 LAB — BASIC METABOLIC PANEL
BUN/Creatinine Ratio: 10 (ref 9–20)
BUN: 14 mg/dL (ref 6–24)
CO2: 24 mmol/L (ref 20–29)
Calcium: 9.1 mg/dL (ref 8.7–10.2)
Chloride: 103 mmol/L (ref 96–106)
Creatinine, Ser: 1.42 mg/dL — ABNORMAL HIGH (ref 0.76–1.27)
Glucose: 110 mg/dL — ABNORMAL HIGH (ref 70–99)
Potassium: 4.2 mmol/L (ref 3.5–5.2)
Sodium: 141 mmol/L (ref 134–144)
eGFR: 64 mL/min/{1.73_m2} (ref >59)

## 2023-06-12 LAB — C-REACTIVE PROTEIN: CRP: 9 mg/L (ref 0–10)

## 2023-06-12 LAB — ADALIMUMAB+AB (SERIAL MONITOR): Adalimumab Drug Level: 4.3 ug/mL

## 2023-06-27 ENCOUNTER — Encounter: Payer: Self-pay | Admitting: Internal Medicine

## 2023-06-27 ENCOUNTER — Ambulatory Visit (INDEPENDENT_AMBULATORY_CARE_PROVIDER_SITE_OTHER): Payer: Medicaid Other | Admitting: Internal Medicine

## 2023-06-27 VITALS — BP 120/75 | HR 81 | Ht 69.0 in | Wt 245.2 lb

## 2023-06-27 DIAGNOSIS — J309 Allergic rhinitis, unspecified: Secondary | ICD-10-CM | POA: Insufficient documentation

## 2023-06-27 DIAGNOSIS — K5100 Ulcerative (chronic) pancolitis without complications: Secondary | ICD-10-CM

## 2023-06-27 DIAGNOSIS — J3089 Other allergic rhinitis: Secondary | ICD-10-CM | POA: Diagnosis not present

## 2023-06-27 NOTE — Patient Instructions (Signed)
 It was a pleasure to see you today.  Thank you for giving [place] the opportunity to be involved in your care.  Below is a brief recap of your visit and next steps.  We will plan to see you again in 6 months.  Summary No medication changes today We will plan for follow up in 6 months

## 2023-06-27 NOTE — Progress Notes (Signed)
 Established Patient Office Visit  Subjective   Patient ID: Juan Salazar, male    DOB: [birth year]  Age: 43 y.o. MRN: [id number]  Chief Complaint  Patient presents with   Follow-up    Six month follow up    Juan Salazar returns to care today for follow-up.  He was last evaluated by me in February 2024 as a new patient presenting to establish care.  He endorses a history of chronic diarrhea at that time.  Basic labs were ordered and 69-month follow-up was arranged.  Ultimately, he was referred to gastroenterology for evaluation in the setting of chronic diarrhea.  He underwent screening colonoscopy in August, which revealed diffuse, severe inflammation secondary to pancolitis ulcerative colitis.  He has since started Humira .  Last seen by GI for follow-up in early December.  Juan Salazar has also been evaluated by allergy and asthma in the setting of allergic rhinitis and possible food allergies.  Today he continues to endorse chronic diarrhea.  Past Medical History:  Diagnosis Date   Psoriasis    UC (ulcerative colitis) (HCC)    pancolitis   Past Surgical History:  Procedure Laterality Date   BIOPSY  01/24/2023   Procedure: BIOPSY;  Surgeon: Cindie Carlin POUR, DO;  Location: [facility];  Service: Endoscopy;;   COLONOSCOPY WITH PROPOFOL  N/A 01/24/2023   Procedure: COLONOSCOPY WITH PROPOFOL ;  Surgeon: Cindie Carlin POUR, DO;  Location: [facility];  Service: Endoscopy;  Laterality: N/A;  10:15am, asa 2   none     POLYPECTOMY  01/24/2023   Procedure: POLYPECTOMY INTESTINAL;  Surgeon: Cindie Carlin POUR, DO;  Location: [facility];  Service: Endoscopy;;   Social History   Tobacco Use   Smoking status: Never    Passive exposure: Current   Smokeless tobacco: Never  Vaping Use   Vaping status: Never Used  Substance Use Topics   Alcohol use: Not Currently    Comment: rarely   Drug use: No   Family History  Problem Relation Age of Onset   Hyperlipidemia Mother    Heart disease Mother     Heart disease Father    Psychosis Father    Psychosis Brother    Colon cancer Neg Hx    Celiac disease Neg Hx    Inflammatory bowel disease Neg Hx    No Known Allergies  Review of Systems  Constitutional:  Positive for malaise/fatigue. Negative for chills and fever.  HENT:  Negative for sore throat.   Respiratory:  Negative for cough and shortness of breath.   Cardiovascular:  Negative for chest pain, palpitations and leg swelling.  Gastrointestinal:  Positive for diarrhea (chronic). Negative for abdominal pain, blood in stool, constipation, nausea and vomiting.  Genitourinary:  Negative for dysuria and hematuria.  Musculoskeletal:  Negative for myalgias.  Skin:  Negative for itching and rash.  Neurological:  Negative for dizziness and headaches.  Psychiatric/Behavioral:  Negative for depression and suicidal ideas.      Objective:     BP 120/75 (BP Location: Left Arm, Patient Position: Sitting, Cuff Size: Large)   Pulse 81   Ht 5' 9 (1.753 m)   Wt 245 lb 3.2 oz (111.2 kg)   SpO2 95%   BMI 36.21 kg/m  BP Readings from Last 3 Encounters:  06/27/23 120/75  05/29/23 114/79  05/26/23 122/72   Physical Exam Vitals reviewed.  Constitutional:      General: He is not in acute distress.    Appearance: Normal appearance. He  is obese. He is not ill-appearing.  HENT:     Head: Normocephalic and atraumatic.     Right Ear: External ear normal.     Left Ear: External ear normal.     Nose: Nose normal. No congestion or rhinorrhea.     Mouth/Throat:     Mouth: Mucous membranes are moist.     Pharynx: Oropharynx is clear.  Eyes:     General: No scleral icterus.    Extraocular Movements: Extraocular movements intact.     Conjunctiva/sclera: Conjunctivae normal.     Pupils: Pupils are equal, round, and reactive to light.  Cardiovascular:     Rate and Rhythm: Normal rate and regular rhythm.     Pulses: Normal pulses.     Heart sounds: Normal heart sounds. No murmur  heard. Pulmonary:     Effort: Pulmonary effort is normal.     Breath sounds: Normal breath sounds. No wheezing, rhonchi or rales.  Abdominal:     General: Abdomen is flat. Bowel sounds are normal. There is no distension.     Palpations: Abdomen is soft.     Tenderness: There is no abdominal tenderness.  Musculoskeletal:        General: No swelling or deformity. Normal range of motion.     Cervical back: Normal range of motion.  Skin:    General: Skin is warm and dry.     Capillary Refill: Capillary refill takes less than 2 seconds.  Neurological:     General: No focal deficit present.     Mental Status: He is alert and oriented to person, place, and time.     Motor: No weakness.  Psychiatric:        Mood and Affect: Mood normal.        Behavior: Behavior normal.        Thought Content: Thought content normal.   Last CBC Lab Results  Component Value Date   WBC 11.1 (H) 05/31/2023   HGB 12.8 (L) 05/31/2023   HCT 39.8 05/31/2023   MCV 81 05/31/2023   MCH 26.2 (L) 05/31/2023   RDW 15.2 05/31/2023   PLT 391 05/31/2023   Last metabolic panel Lab Results  Component Value Date   GLUCOSE 110 (H) 05/31/2023   NA 141 05/31/2023   K 4.2 05/31/2023   CL 103 05/31/2023   CO2 24 05/31/2023   BUN 14 05/31/2023   CREATININE 1.42 (H) 05/31/2023   EGFR 64 05/31/2023   CALCIUM 9.1 05/31/2023   PROT 7.3 07/29/2022   ALBUMIN 4.4 07/29/2022   LABGLOB 2.9 07/29/2022   AGRATIO 1.5 07/29/2022   BILITOT <0.2 07/29/2022   ALKPHOS 121 07/29/2022   AST 26 07/29/2022   ALT 26 07/29/2022   ANIONGAP 9 08/29/2021   Last lipids Lab Results  Component Value Date   CHOL 174 07/29/2022   HDL 29 (L) 07/29/2022   LDLCALC 82 07/29/2022   TRIG 386 (H) 07/29/2022   CHOLHDL 6.0 (H) 07/29/2022   Last hemoglobin A1c Lab Results  Component Value Date   HGBA1C 5.8 (H) 07/29/2022   Last thyroid functions Lab Results  Component Value Date   TSH 2.780 07/29/2022   Last vitamin D  Lab Results   Component Value Date   VD25OH 13.9 (L) 07/29/2022   Last vitamin B12 and Folate Lab Results  Component Value Date   VITAMINB12 444 07/29/2022   FOLATE 16.5 07/29/2022   The 10-year ASCVD risk score (Arnett DK, et al., 2019) is: 1.7%  Assessment & Plan:   Problem List Items Addressed This Visit       Allergic rhinitis   Recently evaluated by allergy and asthma.  He endorses symptomatic improvement with azelastine  and fluticasone  nasal sprays.      UC (ulcerative colitis) (HCC) - Primary   Recent diagnosis.  Followed by GI.  Started on Humira .  Last seen by GI in early December.  No significant change in symptoms since starting Humira .  Most recently, he was instructed to increase the frequency of Humira  to weekly instead of biweekly.  He completed a prednisone  taper one week ago as well.  Will assist with scheduling GI follow-up.      Return in about 6 months (around 12/25/2023).   Manus FORBES Fireman, MD

## 2023-06-27 NOTE — Assessment & Plan Note (Signed)
 Recent diagnosis.  Followed by GI.  Started on Humira .  Last seen by GI in early December.  No significant change in symptoms since starting Humira .  Most recently, he was instructed to increase the frequency of Humira  to weekly instead of biweekly.  He completed a prednisone  taper one week ago as well.  Will assist with scheduling GI follow-up.

## 2023-06-27 NOTE — Assessment & Plan Note (Signed)
 Recently evaluated by allergy and asthma.  He endorses symptomatic improvement with azelastine and fluticasone nasal sprays.

## 2023-07-03 ENCOUNTER — Other Ambulatory Visit: Payer: Self-pay | Admitting: Gastroenterology

## 2023-07-03 MED ORDER — HUMIRA (2 PEN) 40 MG/0.4ML ~~LOC~~ AJKT: 40.0000 mg | AUTO-INJECTOR | SUBCUTANEOUS | 11 refills | Status: DC

## 2023-07-03 NOTE — Progress Notes (Signed)
 Sent to [facility]. Says with need P.A.

## 2023-07-04 ENCOUNTER — Telehealth: Payer: Self-pay

## 2023-07-04 NOTE — Telephone Encounter (Signed)
 Pt's insurance has denied weekly dosing of Humira. Please advise.

## 2023-07-04 NOTE — Telephone Encounter (Signed)
 Do we need to complete P.A. or appeal or was this done by specialty pharmacy already. His drug dose is not optimal.

## 2023-07-05 NOTE — Telephone Encounter (Signed)
 Ok, continue every other week until his ov with Dr. Mordechai April in couple of weeks. Make sure to tell the patient he needs to keep that appt to discuss medication options since Humira  weekly is not an option.

## 2023-07-05 NOTE — Telephone Encounter (Signed)
Pt's wife (DPR on file) was made aware and verbalized understanding.

## 2023-07-26 ENCOUNTER — Ambulatory Visit: Payer: Medicaid Other | Admitting: Internal Medicine

## 2023-08-10 ENCOUNTER — Ambulatory Visit: Payer: Medicaid Other | Admitting: Internal Medicine

## 2023-08-31 ENCOUNTER — Ambulatory Visit: Payer: Medicaid Other | Admitting: Internal Medicine

## 2023-09-18 ENCOUNTER — Ambulatory Visit: Payer: Medicaid Other | Admitting: Internal Medicine

## 2023-09-18 ENCOUNTER — Encounter: Payer: Self-pay | Admitting: Internal Medicine

## 2023-09-18 VITALS — BP 117/79 | HR 75 | Ht 69.0 in | Wt 243.0 lb

## 2023-09-18 DIAGNOSIS — R7303 Prediabetes: Secondary | ICD-10-CM | POA: Diagnosis not present

## 2023-09-18 DIAGNOSIS — E669 Obesity, unspecified: Secondary | ICD-10-CM

## 2023-09-18 MED ORDER — WEGOVY 0.25 MG/0.5ML ~~LOC~~ SOAJ: 0.2500 mg | SUBCUTANEOUS | 0 refills | Status: DC

## 2023-09-18 NOTE — Patient Instructions (Signed)
 It was a pleasure to see you today.  Thank you for giving [place] the opportunity to be involved in your care.  Below is a brief recap of your visit and next steps.  We will plan to see you again in 3 months.  Summary We will tentatively plan to start [facility] pending GI approval Follow up in 3 months for weight management

## 2023-09-18 NOTE — Progress Notes (Unsigned)
 Acute Office Visit  Subjective:     Patient ID: Juan Salazar, male    DOB: 1980-12-08, 43 y.o.   MRN: [id number]  Chief Complaint  Patient presents with   Obesity    Discuss weight loss options   Juan Salazar presents today for an acute visit to discuss medication options for weight loss.  He currently weighs 243 pounds.  BMI 35.8.  He recognizes that his weight is negatively contributing to his health and quality of life.  Over the last several months he has made significant lifestyle changes in an effort to lose weight.  He is going to the gym regularly and has made significant dietary changes.  He is frustrated with a lack of results despite adhering to these changes and has a friend that has done well with [facility].  He is specifically interested in starting [facility].  Review of Systems  Constitutional:  Negative for chills and fever.  HENT:  Negative for sore throat.   Respiratory:  Negative for cough and shortness of breath.   Cardiovascular:  Negative for chest pain, palpitations and leg swelling.  Gastrointestinal:  Negative for abdominal pain, blood in stool, constipation, diarrhea, nausea and vomiting.  Genitourinary:  Negative for dysuria and hematuria.  Musculoskeletal:  Negative for myalgias.  Skin:  Negative for itching and rash.  Neurological:  Negative for dizziness and headaches.  Psychiatric/Behavioral:  Negative for depression and suicidal ideas.       Objective:    BP 117/79 (BP Location: Left Arm, Patient Position: Sitting, Cuff Size: Large)   Pulse 75   Ht 5\' 9"  (1.753 m)   Wt 243 lb (110.2 kg)   SpO2 95%   BMI 35.88 kg/m   Physical Exam Vitals reviewed.  Constitutional:      General: He is not in acute distress.    Appearance: Normal appearance. He is obese. He is not ill-appearing.  HENT:     Head: Normocephalic and atraumatic.     Right Ear: External ear normal.     Left Ear: External ear normal.     Nose: Nose normal. No congestion or rhinorrhea.      Mouth/Throat:     Mouth: Mucous membranes are moist.     Pharynx: Oropharynx is clear.  Eyes:     General: No scleral icterus.    Extraocular Movements: Extraocular movements intact.     Conjunctiva/sclera: Conjunctivae normal.     Pupils: Pupils are equal, round, and reactive to light.  Cardiovascular:     Rate and Rhythm: Normal rate and regular rhythm.     Pulses: Normal pulses.     Heart sounds: Normal heart sounds. No murmur heard. Pulmonary:     Effort: Pulmonary effort is normal.     Breath sounds: Normal breath sounds. No wheezing, rhonchi or rales.  Abdominal:     General: Abdomen is flat. Bowel sounds are normal. There is no distension.     Palpations: Abdomen is soft.     Tenderness: There is no abdominal tenderness.  Musculoskeletal:        General: No swelling or deformity. Normal range of motion.     Cervical back: Normal range of motion.  Skin:    General: Skin is warm and dry.     Capillary Refill: Capillary refill takes less than 2 seconds.  Neurological:     General: No focal deficit present.     Mental Status: He is alert and oriented to person, place, and time.  Motor: No weakness.  Psychiatric:        Mood and Affect: Mood normal.        Behavior: Behavior normal.        Thought Content: Thought content normal.       Assessment & Plan:   Problem List Items Addressed This Visit       Obesity (BMI 30-39.9) - Primary   Presenting today for an acute visit to discuss medication options for weight loss as noted above.  Current weight 243 pounds.  BMI 35.8.  History of prediabetes. He has made significant lifestyle changes aimed at losing weight over the last several months.  He has a close friend that has done well with [facility] and he is interested in starting medication.  He has a known history of ulcerative colitis.  Not on any medication currently. -Treatment options reviewed.  Discussed with gastroenterology (Dr. Marletta Lor) given history of UC.   Through shared decision making, Wegovy 0.25 mg weekly has been prescribed today.  The importance of adhering to lifestyle modifications aimed at weight loss was reinforced.  We will tentatively plan for follow-up in 3 months for weight management.      Meds ordered this encounter  Medications   Semaglutide-Weight Management (WEGOVY) 0.25 MG/0.5ML SOAJ    Sig: Inject 0.25 mg into the skin once a week.    Dispense:  2 mL    Refill:  0    Return in about 3 months (around 12/18/2023).  Billie Lade, MD

## 2023-09-19 ENCOUNTER — Other Ambulatory Visit (HOSPITAL_COMMUNITY): Payer: Self-pay

## 2023-09-19 ENCOUNTER — Encounter: Payer: Self-pay | Admitting: Internal Medicine

## 2023-09-19 ENCOUNTER — Telehealth: Payer: Self-pay | Admitting: Pharmacy Technician

## 2023-09-19 DIAGNOSIS — E669 Obesity, unspecified: Secondary | ICD-10-CM | POA: Insufficient documentation

## 2023-09-19 NOTE — Telephone Encounter (Signed)
 Pharmacy Patient Advocate Encounter  Received notification from [facility] that Prior Authorization for Wegovy 0.25MG /0.5ML auto-injectors has been APPROVED from 09/19/2023 to 03/17/2024. Ran test claim, Copay is $4.00. This test claim was processed through [facility]- copay amounts may vary at other pharmacies due to pharmacy/plan contracts, or as the patient moves through the different stages of their insurance plan.   PA #/Case ID/Reference #: [id number]

## 2023-09-19 NOTE — Assessment & Plan Note (Addendum)
 Presenting today for an acute visit to discuss medication options for weight loss as noted above.  Current weight 243 pounds.  BMI 35.8.  History of prediabetes. He has made significant lifestyle changes aimed at losing weight over the last several months.  He has a close friend that has done well with [facility] and he is interested in starting medication.  He has a known history of ulcerative colitis.  Not on any medication currently. -Treatment options reviewed.  Discussed with gastroenterology (Dr. Marletta Lor) given history of UC.  Through shared decision making, Wegovy 0.25 mg weekly has been prescribed today.  The importance of adhering to lifestyle modifications aimed at weight loss was reinforced.  We will tentatively plan for follow-up in 3 months for weight management.

## 2023-09-19 NOTE — Telephone Encounter (Signed)
 Pharmacy Patient Advocate Encounter   Received notification from CoverMyMeds that prior authorization for [facility] 0.25MG /0.5ML auto-injectors is required/requested.   Insurance verification completed.   The patient is insured through [facility] .   Per test claim: PA required; PA submitted to above mentioned insurance via CoverMyMeds Key/confirmation #/EOC [id number] Status is pending

## 2023-10-03 ENCOUNTER — Telehealth: Payer: Self-pay

## 2023-10-03 DIAGNOSIS — E669 Obesity, unspecified: Secondary | ICD-10-CM

## 2023-10-03 DIAGNOSIS — R7303 Prediabetes: Secondary | ICD-10-CM

## 2023-10-03 MED ORDER — WEGOVY 0.5 MG/0.5ML ~~LOC~~ SOAJ: 0.5000 mg | SUBCUTANEOUS | 0 refills | Status: DC

## 2023-10-03 NOTE — Telephone Encounter (Signed)
 Copied from CRM [id number]. Topic: Clinical - Medication Question >> Oct 03, 2023  2:09 PM Star East wrote: Reason for CRM: Semaglutide-Weight Management [facility]) 0.25 MG/0.5ML SOAJ- has one dose left and will need the higher called in- wife Devra Fontana [phone]

## 2023-10-10 ENCOUNTER — Telehealth: Payer: Self-pay | Admitting: Internal Medicine

## 2023-10-10 DIAGNOSIS — E669 Obesity, unspecified: Secondary | ICD-10-CM

## 2023-10-10 DIAGNOSIS — R7303 Prediabetes: Secondary | ICD-10-CM

## 2023-10-10 MED ORDER — WEGOVY 0.5 MG/0.5ML ~~LOC~~ SOAJ: 0.5000 mg | SUBCUTANEOUS | 0 refills | Status: DC

## 2023-10-10 NOTE — Telephone Encounter (Signed)
 Patient advised.

## 2023-10-10 NOTE — Telephone Encounter (Signed)
 Patient is ready to step up dosing- needs new Rx  Copied from CRM [id number]. Topic: Clinical - Medication Question >> Oct 03, 2023  2:09 PM Star East wrote: Reason for CRM: Semaglutide -Weight Management (WEGOVY ) 0.25 MG/0.5ML SOAJ- has one dose left and will need the higher called in- wife Devra Fontana [phone] >> Oct 10, 2023 10:04 AM Lizabeth Riggs wrote: Devra Fontana is calling back and Brysan will take his last shot today. She called pharmacy [facility])  and they did not receive the reorder. Please call Devra Fontana to her know when order will be sent. Her number is [phone]

## 2023-10-10 NOTE — Addendum Note (Signed)
 Addended by: Holy Battenfield E on: 10/10/2023 12:06 PM   Modules accepted: Orders

## 2023-10-11 ENCOUNTER — Ambulatory Visit: Admitting: Internal Medicine

## 2023-10-18 ENCOUNTER — Ambulatory Visit: Payer: Self-pay | Admitting: Internal Medicine

## 2023-11-06 ENCOUNTER — Other Ambulatory Visit: Payer: Self-pay | Admitting: Internal Medicine

## 2023-11-06 DIAGNOSIS — E669 Obesity, unspecified: Secondary | ICD-10-CM

## 2023-11-06 DIAGNOSIS — R7303 Prediabetes: Secondary | ICD-10-CM

## 2023-11-06 NOTE — Telephone Encounter (Signed)
 Copied from CRM [id number]. Topic: Clinical - Medication Refill >> Nov 06, 2023  1:37 PM Emylou G wrote: Medication: Semaglutide -Weight Management (WEGOVY ) 0.5 MG/0.5ML SOAJ  Has the patient contacted their pharmacy? Yes (Agent: If no, request that the patient contact the pharmacy for the refill. If patient does not wish to contact the pharmacy document the reason why and proceed with request.) (Agent: If yes, when and what did the pharmacy advise?) said to call us   This is the patient's preferred pharmacy:  [facility] [address] [place] - [address] [place] [address] Phone: [phone] Fax: [phone]  Is this the correct pharmacy for this prescription? Yes If no, delete pharmacy and type the correct one.   Has the prescription been filled recently? Yes  Is the patient out of the medication? Yes 1 shot left  Has the patient been seen for an appointment in the last year OR does the patient have an upcoming appointment? Yes  Can we respond through MyChart? no  Agent: Please be advised that Rx refills may take up to 3 business days. We ask that you follow-up with your pharmacy.

## 2023-11-07 MED ORDER — WEGOVY 1 MG/0.5ML ~~LOC~~ SOAJ: 1.0000 mg | SUBCUTANEOUS | 0 refills | Status: DC

## 2023-11-13 ENCOUNTER — Other Ambulatory Visit: Payer: Self-pay

## 2023-11-13 ENCOUNTER — Ambulatory Visit: Payer: Self-pay

## 2023-11-13 ENCOUNTER — Emergency Department (HOSPITAL_COMMUNITY)

## 2023-11-13 ENCOUNTER — Ambulatory Visit (HOSPITAL_COMMUNITY): Admitting: Registered Nurse

## 2023-11-13 ENCOUNTER — Encounter (HOSPITAL_COMMUNITY): Payer: Self-pay | Admitting: Emergency Medicine

## 2023-11-13 ENCOUNTER — Observation Stay (HOSPITAL_COMMUNITY)
Admission: EM | Admit: 2023-11-13 | Discharge: 2023-11-14 | Disposition: A | Discharge status: Home / Self Care | Attending: Surgery | Admitting: Surgery

## 2023-11-13 ENCOUNTER — Encounter (HOSPITAL_COMMUNITY)
Admission: EM | Disposition: A | Discharge status: Home / Self Care | Payer: Self-pay | Source: Home / Self Care | Attending: Emergency Medicine

## 2023-11-13 DIAGNOSIS — K3589 Other acute appendicitis without perforation or gangrene: Principal | ICD-10-CM | POA: Insufficient documentation

## 2023-11-13 DIAGNOSIS — R109 Unspecified abdominal pain: Secondary | ICD-10-CM | POA: Diagnosis not present

## 2023-11-13 DIAGNOSIS — K3580 Unspecified acute appendicitis: Secondary | ICD-10-CM | POA: Diagnosis not present

## 2023-11-13 DIAGNOSIS — K37 Unspecified appendicitis: Secondary | ICD-10-CM | POA: Diagnosis not present

## 2023-11-13 DIAGNOSIS — R112 Nausea with vomiting, unspecified: Secondary | ICD-10-CM | POA: Diagnosis not present

## 2023-11-13 DIAGNOSIS — K353 Acute appendicitis with localized peritonitis, without perforation or gangrene: Secondary | ICD-10-CM | POA: Diagnosis not present

## 2023-11-13 DIAGNOSIS — R6883 Chills (without fever): Secondary | ICD-10-CM | POA: Diagnosis not present

## 2023-11-13 DIAGNOSIS — R1031 Right lower quadrant pain: Secondary | ICD-10-CM | POA: Diagnosis present

## 2023-11-13 DIAGNOSIS — N201 Calculus of ureter: Principal | ICD-10-CM | POA: Insufficient documentation

## 2023-11-13 DIAGNOSIS — Z7722 Contact with and (suspected) exposure to environmental tobacco smoke (acute) (chronic): Secondary | ICD-10-CM | POA: Diagnosis not present

## 2023-11-13 DIAGNOSIS — Z9049 Acquired absence of other specified parts of digestive tract: Secondary | ICD-10-CM

## 2023-11-13 HISTORY — PX: LAPAROSCOPIC APPENDECTOMY: SHX408

## 2023-11-13 LAB — CBC
HCT: 40.2 % (ref 39.0–52.0)
Hemoglobin: 12.9 g/dL — ABNORMAL LOW (ref 13.0–17.0)
MCH: 26.4 pg (ref 26.0–34.0)
MCHC: 32.1 g/dL (ref 30.0–36.0)
MCV: 82.2 fL (ref 80.0–100.0)
Platelets: 337 10*3/uL (ref 150–400)
RBC: 4.89 MIL/uL (ref 4.22–5.81)
RDW: 16.3 % — ABNORMAL HIGH (ref 11.5–15.5)
WBC: 9.7 10*3/uL (ref 4.0–10.5)
nRBC: 0 % (ref 0.0–0.2)

## 2023-11-13 LAB — URINALYSIS, ROUTINE W REFLEX MICROSCOPIC
Bacteria, UA: NONE SEEN
Bilirubin Urine: NEGATIVE
Glucose, UA: NEGATIVE mg/dL
Ketones, ur: 20 mg/dL — AB
Leukocytes,Ua: NEGATIVE
Nitrite: NEGATIVE
Protein, ur: 30 mg/dL — AB
Specific Gravity, Urine: 1.017 (ref 1.005–1.030)
pH: 5 (ref 5.0–8.0)

## 2023-11-13 LAB — BASIC METABOLIC PANEL WITH GFR
Anion gap: 9 (ref 5–15)
BUN: 11 mg/dL (ref 6–20)
CO2: 23 mmol/L (ref 22–32)
Calcium: 8.7 mg/dL — ABNORMAL LOW (ref 8.9–10.3)
Chloride: 102 mmol/L (ref 98–111)
Creatinine, Ser: 2.2 mg/dL — ABNORMAL HIGH (ref 0.61–1.24)
GFR, Estimated: 38 mL/min — ABNORMAL LOW (ref >60)
Glucose, Bld: 96 mg/dL (ref 70–99)
Potassium: 4.3 mmol/L (ref 3.5–5.1)
Sodium: 134 mmol/L — ABNORMAL LOW (ref 135–145)

## 2023-11-13 LAB — ABO/RH: ABO/RH(D): A POS

## 2023-11-13 LAB — TYPE AND SCREEN
ABO/RH(D): A POS
Antibody Screen: NEGATIVE

## 2023-11-13 LAB — LACTIC ACID, PLASMA: Lactic Acid, Venous: 1.3 mmol/L (ref 0.5–1.9)

## 2023-11-13 SURGERY — APPENDECTOMY, LAPAROSCOPIC
Anesthesia: General

## 2023-11-13 MED ORDER — LACTATED RINGERS IV SOLN: INTRAVENOUS | Status: DC | PRN

## 2023-11-13 MED ORDER — DEXAMETHASONE SODIUM PHOSPHATE 10 MG/ML IJ SOLN
INTRAMUSCULAR | Status: DC | PRN
  Administered 2023-11-13: 5 mg via INTRAVENOUS

## 2023-11-13 MED ORDER — ONDANSETRON HCL 4 MG/2ML IJ SOLN
4.0000 mg | Freq: Once | INTRAMUSCULAR | Status: AC
  Administered 2023-11-13: 4 mg via INTRAVENOUS
  Filled 2023-11-13: qty 2

## 2023-11-13 MED ORDER — MIDAZOLAM HCL 2 MG/2ML IJ SOLN
INTRAMUSCULAR | Status: DC | PRN
  Administered 2023-11-13: 2 mg via INTRAVENOUS

## 2023-11-13 MED ORDER — ROCURONIUM BROMIDE 10 MG/ML (PF) SYRINGE
PREFILLED_SYRINGE | INTRAVENOUS | Status: DC | PRN
  Administered 2023-11-13: 70 mg via INTRAVENOUS

## 2023-11-13 MED ORDER — BUPIVACAINE-EPINEPHRINE (PF) 0.25% -1:200000 IJ SOLN
INTRAMUSCULAR | Status: AC
  Filled 2023-11-13: qty 30

## 2023-11-13 MED ORDER — LIDOCAINE 2% (20 MG/ML) 5 ML SYRINGE
INTRAMUSCULAR | Status: DC | PRN
  Administered 2023-11-13: 60 mg via INTRAVENOUS

## 2023-11-13 MED ORDER — PROPOFOL 10 MG/ML IV BOLUS
INTRAVENOUS | Status: AC
  Filled 2023-11-13: qty 20

## 2023-11-13 MED ORDER — FENTANYL CITRATE PF 50 MCG/ML IJ SOSY
50.0000 ug | PREFILLED_SYRINGE | INTRAMUSCULAR | Status: DC | PRN
  Administered 2023-11-13: 50 ug via INTRAVENOUS
  Filled 2023-11-13: qty 1

## 2023-11-13 MED ORDER — PIPERACILLIN-TAZOBACTAM 4.5 G IVPB
3.3750 g | Freq: Once | INTRAVENOUS | Status: DC
  Filled 2023-11-13: qty 100

## 2023-11-13 MED ORDER — BUPIVACAINE-EPINEPHRINE 0.25% -1:200000 IJ SOLN
INTRAMUSCULAR | Status: DC | PRN
  Administered 2023-11-13: 25 mL

## 2023-11-13 MED ORDER — PROPOFOL 10 MG/ML IV BOLUS
INTRAVENOUS | Status: DC | PRN
  Administered 2023-11-13: 180 mg via INTRAVENOUS

## 2023-11-13 MED ORDER — PIPERACILLIN-TAZOBACTAM 3.375 G IVPB 30 MIN
3.3750 g | Freq: Once | INTRAVENOUS | Status: AC
  Administered 2023-11-13: 3.375 g via INTRAVENOUS
  Filled 2023-11-13: qty 50

## 2023-11-13 MED ORDER — FENTANYL CITRATE (PF) 250 MCG/5ML IJ SOLN
INTRAMUSCULAR | Status: AC
  Filled 2023-11-13: qty 5

## 2023-11-13 MED ORDER — 0.9 % SODIUM CHLORIDE (POUR BTL) OPTIME
TOPICAL | Status: DC | PRN
  Administered 2023-11-13: 1000 mL

## 2023-11-13 MED ORDER — LACTATED RINGERS IV BOLUS
1000.0000 mL | Freq: Once | INTRAVENOUS | Status: AC
  Administered 2023-11-13: 1000 mL via INTRAVENOUS

## 2023-11-13 MED ORDER — FENTANYL CITRATE (PF) 250 MCG/5ML IJ SOLN
INTRAMUSCULAR | Status: DC | PRN
Start: 2023-11-13 — End: 2023-11-14
  Administered 2023-11-13: 50 ug via INTRAVENOUS
  Administered 2023-11-13: 150 ug via INTRAVENOUS
  Administered 2023-11-14: 50 ug via INTRAVENOUS

## 2023-11-13 MED ORDER — ACETAMINOPHEN 500 MG PO TABS
1000.0000 mg | ORAL_TABLET | Freq: Once | ORAL | Status: AC
  Administered 2023-11-13: 1000 mg via ORAL
  Filled 2023-11-13: qty 2

## 2023-11-13 MED ORDER — SODIUM CHLORIDE 0.9 % IR SOLN
Status: DC | PRN
  Administered 2023-11-13: 1000 mL

## 2023-11-13 MED ORDER — MIDAZOLAM HCL 2 MG/2ML IJ SOLN
INTRAMUSCULAR | Status: AC
  Filled 2023-11-13: qty 2

## 2023-11-13 SURGICAL SUPPLY — 42 items
BAG COUNTER SPONGE SURGICOUNT (BAG) ×1 IMPLANT
BLADE CLIPPER SURG (BLADE) IMPLANT
CANISTER SUCTION 3000ML PPV (SUCTIONS) ×1 IMPLANT
CHLORAPREP W/TINT 26 (MISCELLANEOUS) ×1 IMPLANT
CLIP APPLIE 5 13 M/L LIGAMAX5 (MISCELLANEOUS) IMPLANT
COVER SURGICAL LIGHT HANDLE (MISCELLANEOUS) ×1 IMPLANT
CUTTER FLEX LINEAR 45M (STAPLE) IMPLANT
DERMABOND ADVANCED .7 DNX12 (GAUZE/BANDAGES/DRESSINGS) ×1 IMPLANT
DERMABOND ADVANCED .7 DNX6 (GAUZE/BANDAGES/DRESSINGS) IMPLANT
ELECTRODE REM PT RTRN 9FT ADLT (ELECTROSURGICAL) ×1 IMPLANT
ENDOLOOP SUT PDS II 0 18 (SUTURE) IMPLANT
GLOVE BIO SURGEON STRL SZ7 (GLOVE) ×1 IMPLANT
GLOVE BIOGEL PI IND STRL 7.0 (GLOVE) IMPLANT
GLOVE SURG SS PI 7.0 STRL IVOR (GLOVE) IMPLANT
GOWN STRL REUS W/ TWL LRG LVL3 (GOWN DISPOSABLE) ×1 IMPLANT
GOWN STRL REUS W/ TWL XL LVL3 (GOWN DISPOSABLE) ×1 IMPLANT
GRASPER SUT TROCAR 14GX15 (MISCELLANEOUS) IMPLANT
IRRIGATION SUCT STRKRFLW 2 WTP (MISCELLANEOUS) IMPLANT
KIT BASIN OR (CUSTOM PROCEDURE TRAY) ×1 IMPLANT
KIT TURNOVER KIT B (KITS) ×1 IMPLANT
NDL INSUFFLATION 14GA 120MM (NEEDLE) IMPLANT
NEEDLE INSUFFLATION 14GA 120MM (NEEDLE) ×1 IMPLANT
NS IRRIG 1000ML POUR BTL (IV SOLUTION) ×1 IMPLANT
PAD ARMBOARD POSITIONER FOAM (MISCELLANEOUS) ×2 IMPLANT
PENCIL BUTTON HOLSTER BLD 10FT (ELECTRODE) IMPLANT
RELOAD STAPLE 45 3.5 BLU ETS (ENDOMECHANICALS) IMPLANT
RELOAD STAPLE TA45 3.5 REG BLU (ENDOMECHANICALS) IMPLANT
SCISSORS LAP 5X35 DISP (ENDOMECHANICALS) IMPLANT
SET TUBE SMOKE EVAC HIGH FLOW (TUBING) ×1 IMPLANT
SHEARS HARMONIC ACE PLUS 36CM (ENDOMECHANICALS) IMPLANT
SLEEVE Z-THREAD 5X100MM (TROCAR) ×1 IMPLANT
SUT MNCRL AB 4-0 PS2 18 (SUTURE) ×1 IMPLANT
SUT VICRYL 0 UR6 27IN ABS (SUTURE) IMPLANT
SYSTEM BAG RETRIEVAL 10MM (BASKET) ×1 IMPLANT
TOWEL GREEN STERILE (TOWEL DISPOSABLE) ×1 IMPLANT
TOWEL GREEN STERILE FF (TOWEL DISPOSABLE) ×1 IMPLANT
TRAY FOLEY W/BAG SLVR 14FR (SET/KITS/TRAYS/PACK) IMPLANT
TRAY LAPAROSCOPIC MC (CUSTOM PROCEDURE TRAY) ×1 IMPLANT
TROCAR Z THREAD OPTICAL 12X100 (TROCAR) ×1 IMPLANT
TROCAR Z-THREAD OPTICAL 5X100M (TROCAR) ×1 IMPLANT
WARMER LAPAROSCOPE (MISCELLANEOUS) ×1 IMPLANT
WATER STERILE IRR 1000ML POUR (IV SOLUTION) ×1 IMPLANT

## 2023-11-13 NOTE — ED Provider Notes (Signed)
 [place] EMERGENCY DEPARTMENT AT [facility] Provider Note  History  Chief Complaint:  Flank Pain   Flank Pain   JAELON GATLEY is a 43 y.o. male with a history of ulcerative colitis who presents the emergency department for right flank and right lower quadrant tenderness.  He reports that this started on Saturday.  He reports that its gotten progressively worse since then.  No fevers or chills.  Has had anorexia, nausea and vomiting.  The pain got intense over the past 24 hours therefore he sought emergency care today.  He has no allergies.  Does have a history of ulcerative colitis.  Is currently on no medications.  He describes no dysuria, hematuria.  Past Medical History:  Diagnosis Date   Psoriasis    UC (ulcerative colitis) (HCC)    pancolitis    Past Surgical History:  Procedure Laterality Date   BIOPSY  01/24/2023   Procedure: BIOPSY;  Surgeon: Vinetta Greening, DO;  Location: [facility];  Service: Endoscopy;;   COLONOSCOPY WITH PROPOFOL  N/A 01/24/2023   Procedure: COLONOSCOPY WITH PROPOFOL ;  Surgeon: Vinetta Greening, DO;  Location: [facility];  Service: Endoscopy;  Laterality: N/A;  10:15am, asa 2   none     POLYPECTOMY  01/24/2023   Procedure: POLYPECTOMY INTESTINAL;  Surgeon: Vinetta Greening, DO;  Location: [facility];  Service: Endoscopy;;    Family History  Problem Relation Age of Onset   Hyperlipidemia Mother    Heart disease Mother    Heart disease Father    Psychosis Father    Psychosis Brother    Colon cancer Neg Hx    Celiac disease Neg Hx    Inflammatory bowel disease Neg Hx     Social History   Tobacco Use   Smoking status: Never    Passive exposure: Current   Smokeless tobacco: Never  Vaping Use   Vaping status: Never Used  Substance Use Topics   Alcohol use: Not Currently    Comment: rarely   Drug use: No    Review of Systems  Review of Systems  Genitourinary:  Positive for flank pain.     Reviewed and documented  in HPI if pertinent.   Physical Exam   ED Triage Vitals  Encounter Vitals Group     BP 11/13/23 1902 (!) 121/95     Systolic BP Percentile --      Diastolic BP Percentile --      Pulse Rate 11/13/23 1902 96     Resp 11/13/23 1902 (!) 28     Temp 11/13/23 1902 100.2 F (37.9 C)     Temp src --      SpO2 11/13/23 1902 100 %     Weight 11/13/23 1902 242 lb 8.1 oz (110 kg)     Height --      Head Circumference --      Peak Flow --      Pain Score 11/13/23 1901 10     Pain Loc --      Pain Education --      Exclude from Growth Chart --      Physical Exam Vitals and nursing note reviewed.  Constitutional:      General: He is not in acute distress.    Appearance: He is well-developed.  HENT:     Head: Normocephalic and atraumatic.  Eyes:     Conjunctiva/sclera: Conjunctivae normal.  Cardiovascular:     Rate and Rhythm: Normal  rate and regular rhythm.     Heart sounds: No murmur heard. Pulmonary:     Effort: Pulmonary effort is normal. No respiratory distress.     Breath sounds: Normal breath sounds.  Abdominal:     General: There is no distension.     Palpations: Abdomen is soft.     Tenderness: There is abdominal tenderness in the right lower quadrant. There is right CVA tenderness. There is no guarding or rebound.  Musculoskeletal:        General: No swelling.     Cervical back: Neck supple.  Skin:    General: Skin is warm and dry.     Capillary Refill: Capillary refill takes less than 2 seconds.  Neurological:     Mental Status: He is alert.  Psychiatric:        Mood and Affect: Mood normal.      Procedures   Procedures  ED Course - Medical Decision Making  Brief Overview JAYMZ TRAYWICK is a 43 y.o. male who presents as per above.  I have reviewed the nursing documentation for past medical history, family history, and social history and agree.  I have reviewed the patient's vital signs. Febrile  Initial Differential Diagnoses: I am primarily  concerned for nephrolithiasis, ureterolithiasis, electrolyte abnormalities, AKI, UTI.  Therapies: These medications and interventions were provided for the patient while in the ED.  Medications  fentaNYL (SUBLIMAZE) injection 50 mcg (50 mcg Intravenous Given 11/13/23 1916)  piperacillin-tazobactam (ZOSYN) IVPB 3.375 g (3.375 g Intravenous New Bag/Given 11/13/23 2153)  ondansetron  (ZOFRAN ) injection 4 mg (4 mg Intravenous Given 11/13/23 1916)  lactated ringers  bolus 1,000 mL (1,000 mLs Intravenous New Bag/Given 11/13/23 2142)  acetaminophen  (TYLENOL ) tablet 1,000 mg (1,000 mg Oral Given 11/13/23 2138)    Testing Results: On my interpretation labs are significant for : No leukocytosis Creatinine 2.20 UA without evidence of infection  On my interpretation imaging is significant for: CT 6 mm stone within the right ureter; appendicitis  See the EMR for full details regarding lab and imaging results.  Medical Decision Making 43 year old male who presents the emergency department for right flank pain radiating down his right lower quadrant.  Is been going on for 3 days.  On exam patient does have pain in these areas.  He is febrile.  Hemodynamically stable.  Lungs clear to auscultation bilaterally.  No other source of infection.  He denies any urinary symptoms.  Review of laboratory studies patient does not have a leukocytosis.  His creatinine is elevated from baseline.  No significant electrolyte abnormalities.  CT imaging is concerning for a 6 mm stone within the right mid ureter as well as acute appendicitis without perforation.  I discussed with general surgery regarding patient.  They are recommended appendectomy.  Patient does have a history of ulcerative colitis and I confirmed with him that he is on no medications for this.  Regarding the patient's nephrolithiasis this will need to be addressed after appendectomy.  Patient will need pain control as well as flomax postoperatively.   Currently it appears that the infection is coming from appendicitis.  Patient's urinalysis does not appear to be infected.  He has no severe hydronephrosis.  Do not feel that urgent/emergent urological consult is indicated currently.  General surgery posted patient for appendectomy.  Patient transported to the OR.  Amount and/or Complexity of Data Reviewed Labs: ordered.  Risk OTC drugs. Prescription drug management.     ### All radiography studies, electrocardiograms, and laboratory data  were personally reviewed by me and incorporated into my medical decision making. Impression   1. Ureterolithiasis   2. Other acute appendicitis      Note: Dragon medical dictation software was used in the creation of this note.     Arminda Landmark, MD 11/13/23 2204    Deatra Face, MD 11/13/23 2212

## 2023-11-13 NOTE — Anesthesia Procedure Notes (Signed)
 Procedure Name: Intubation Date/Time: 11/13/2023 11:26 PM  Performed by: Velva Molinari C., CRNAPre-anesthesia Checklist: Patient identified, Emergency Drugs available, Suction available, Patient being monitored and Timeout performed Patient Re-evaluated:Patient Re-evaluated prior to induction Oxygen Delivery Method: Circle system utilized Preoxygenation: Pre-oxygenation with 100% oxygen Induction Type: IV induction Ventilation: Mask ventilation without difficulty Laryngoscope Size: Mac and 4 Grade View: Grade I Tube type: Oral Tube size: 7.5 mm Number of attempts: 1 Airway Equipment and Method: Stylet Placement Confirmation: ETT inserted through vocal cords under direct vision, positive ETCO2 and breath sounds checked- equal and bilateral Secured at: 23 cm Tube secured with: Tape Dental Injury: Teeth and Oropharynx as per pre-operative assessment

## 2023-11-13 NOTE — ED Triage Notes (Signed)
 Right flank pain with chills, nausea and vomiting x 2 days.

## 2023-11-13 NOTE — Anesthesia Preprocedure Evaluation (Signed)
 Anesthesia Evaluation  Patient identified by MRN, date of birth, ID band Patient awake    Reviewed: Allergy & Precautions, H&P , NPO status , Patient's Chart, lab work & pertinent test results  Airway Mallampati: III  TM Distance: >3 FB Neck ROM: Full    Dental  (+) Dental Advisory Given, Poor Dentition,    Pulmonary neg pulmonary ROS   breath sounds clear to auscultation       Cardiovascular negative cardio ROS  Rhythm:Regular     Neuro/Psych negative neurological ROS  negative psych ROS   GI/Hepatic Neg liver ROS, PUD,GERD  ,,appendicitis   Endo/Other  negative endocrine ROS    Renal/GU negative Renal ROS     Musculoskeletal  (+) Arthritis  (psoriatic arthritis),    Abdominal   Peds negative pediatric ROS (+)  Hematology negative hematology ROS (+)   Anesthesia Other Findings   Reproductive/Obstetrics                              Anesthesia Physical Anesthesia Plan  ASA: 2  Anesthesia Plan: General   Post-op Pain Management: Toradol IV (intra-op)*   Induction: Intravenous, Rapid sequence and Cricoid pressure planned  PONV Risk Score and Plan: 2 and Ondansetron  and Dexamethasone   Airway Management Planned: Oral ETT  Additional Equipment: None  Intra-op Plan:   Post-operative Plan: Extubation in OR  Informed Consent: I have reviewed the patients History and Physical, chart, labs and discussed the procedure including the risks, benefits and alternatives for the proposed anesthesia with the patient or authorized representative who has indicated his/her understanding and acceptance.     Dental advisory given  Plan Discussed with: CRNA  Anesthesia Plan Comments:         Anesthesia Quick Evaluation

## 2023-11-13 NOTE — ED Notes (Signed)
 Wife, Jacques Fife  [phone] - Next of Kin

## 2023-11-13 NOTE — H&P (Signed)
 Juan Salazar 04-Jun-1981  [id number].    HPI:  43 year old male with a hx of UC (not on meds) who presents with 2 days of abdominal pain, nausea, and anorexia. He underwent a CT that showed a right-sided ureter stone without hydronephrosis as well as appendicitis. WBC 10. He is AF and HDS  ROS: Review of Systems  Constitutional:  Positive for chills and malaise/fatigue.  HENT: Negative.    Eyes: Negative.   Respiratory: Negative.    Cardiovascular: Negative.   Gastrointestinal:  Positive for abdominal pain and nausea.  Genitourinary: Negative.   Musculoskeletal: Negative.   Skin: Negative.   Neurological: Negative.   Endo/Heme/Allergies: Negative.   Psychiatric/Behavioral: Negative.      Family History  Problem Relation Age of Onset   Hyperlipidemia Mother    Heart disease Mother    Heart disease Father    Psychosis Father    Psychosis Brother    Colon cancer Neg Hx    Celiac disease Neg Hx    Inflammatory bowel disease Neg Hx     Past Medical History:  Diagnosis Date   Psoriasis    UC (ulcerative colitis) (HCC)    pancolitis    Past Surgical History:  Procedure Laterality Date   BIOPSY  01/24/2023   Procedure: BIOPSY;  Surgeon: Vinetta Greening, DO;  [facility];  Service: Endoscopy;;   COLONOSCOPY WITH PROPOFOL  N/A 01/24/2023   Procedure: COLONOSCOPY WITH PROPOFOL ;  Surgeon: Vinetta Greening, DO;  [facility];  Service: Endoscopy;  Laterality: N/A;  10:15am, asa 2   none     POLYPECTOMY  01/24/2023   Procedure: POLYPECTOMY INTESTINAL;  Surgeon: Vinetta Greening, DO;  [facility];  Service: Endoscopy;;    Social History:  reports that he has never smoked. He has been exposed to tobacco smoke. He has never used smokeless tobacco. He reports that he does not currently use alcohol. He reports that he does not use drugs.  Allergies: No Known Allergies  (Not in a hospital admission)   Physical Exam: Blood pressure  126/83, pulse 97, temperature (!) 100.5 F (38.1 C), temperature source Oral, resp. rate 20, weight 110 kg, SpO2 100%. Gen: male, NAD Abd: soft, non-distended, mild TTP in the RLQ, no rebound/guarding, no peritoneal signs  Results for orders placed or performed during the hospital encounter of 11/13/23 (from the past 48 hours)  Basic metabolic panel     Status: Abnormal   Collection Time: 11/13/23  7:10 PM  Result Value Ref Range   Sodium 134 (L) 135 - 145 mmol/L   Potassium 4.3 3.5 - 5.1 mmol/L   Chloride 102 98 - 111 mmol/L   CO2 23 22 - 32 mmol/L   Glucose, Bld 96 70 - 99 mg/dL    Comment: Glucose reference range applies only to samples taken after fasting for at least 8 hours.   BUN 11 6 - 20 mg/dL   Creatinine, Ser 9.56 (H) 0.61 - 1.24 mg/dL   Calcium 8.7 (L) 8.9 - 10.3 mg/dL   GFR, Estimated 38 (L) >60 mL/min    Comment: (NOTE) Calculated using the CKD-EPI Creatinine Equation (2021)    Anion gap 9 5 - 15    Comment: Performed at [facility], [address], [place], [place] [address]  CBC     Status: Abnormal   Collection Time: 11/13/23  7:10 PM  Result Value Ref Range   WBC 9.7  4.0 - 10.5 K/uL   RBC 4.89 4.22 - 5.81 MIL/uL   Hemoglobin 12.9 (L) 13.0 - 17.0 g/dL   HCT 16.1 09.6 - 04.5 %   MCV 82.2 80.0 - 100.0 fL   MCH 26.4 26.0 - 34.0 pg   MCHC 32.1 30.0 - 36.0 g/dL   RDW 40.9 (H) 81.1 - 91.4 %   Platelets 337 150 - 400 K/uL   nRBC 0.0 0.0 - 0.2 %    Comment: Performed at [facility], [address], [place], [place] [address]  Urinalysis, Routine w reflex microscopic -Urine, Clean Catch     Status: Abnormal   Collection Time: 11/13/23  8:39 PM  Result Value Ref Range   Color, Urine YELLOW YELLOW   APPearance CLEAR CLEAR   Specific Gravity, Urine 1.017 1.005 - 1.030   pH 5.0 5.0 - 8.0   Glucose, UA NEGATIVE NEGATIVE mg/dL   Hgb urine dipstick SMALL (A) NEGATIVE   Bilirubin Urine NEGATIVE NEGATIVE   Ketones, ur 20 (A) NEGATIVE mg/dL   Protein, ur  30 (A) NEGATIVE mg/dL   Nitrite NEGATIVE NEGATIVE   Leukocytes,Ua NEGATIVE NEGATIVE   RBC / HPF 0-5 0 - 5 RBC/hpf   WBC, UA 0-5 0 - 5 WBC/hpf   Bacteria, UA NONE SEEN NONE SEEN   Squamous Epithelial / HPF 0-5 0 - 5 /HPF   Mucus PRESENT     Comment: Performed at [facility], [address] [place], [place] [address]  Lactic acid, plasma     Status: None   Collection Time: 11/13/23  8:54 PM  Result Value Ref Range   Lactic Acid, Venous 1.3 0.5 - 1.9 mmol/L    Comment: Performed at [facility], [address] [place], [place] [address]   CT Renal Stone Study Result Date: 11/13/2023 CLINICAL DATA:  Abdominal/flank pain, stone suspected Right flank pain with chills, nausea and vomiting x 2 days. EXAM: CT ABDOMEN AND PELVIS WITHOUT CONTRAST TECHNIQUE: Multidetector CT imaging of the abdomen and pelvis was performed following the standard protocol without IV contrast. RADIATION DOSE REDUCTION: This exam was performed according to the departmental dose-optimization program which includes automated exposure control, adjustment of the mA and/or kV according to patient size and/or use of iterative reconstruction technique. COMPARISON:  None Available. FINDINGS: Lower chest: No acute abnormality. Hepatobiliary: No focal liver abnormality. No gallstones, gallbladder wall thickening, or pericholecystic fluid. No biliary dilatation. Pancreas: No focal lesion. Normal pancreatic contour. No surrounding inflammatory changes. No main pancreatic ductal dilatation. Spleen: Normal in size without focal abnormality. Adrenals/Urinary Tract: No adrenal nodule bilaterally. Elongated 6 mm calcified stone within the right mid ureter. Vague fullness of the right collecting system with no associated frank right proximal hydroureteronephrosis. Punctate right nephrolithiasis. No left hydronephrosis. No left nephroureterolithiasis. No definite contour-deforming renal mass. The urinary bladder is unremarkable.  Stomach/Bowel: Stomach is within normal limits. No evidence of bowel wall thickening or dilatation. Fatty infiltration of the wall of the large bowel suggestive of chronic constipation versus chronic inflammatory changes. The appendix is thickening caliber measuring up to 14mm with associated punctate appendicoliths and mild periappendiceal fat stranding. Vascular/Lymphatic: No abdominal aorta or iliac aneurysm. No abdominal, pelvic, or inguinal lymphadenopathy. Reproductive: Prostate is unremarkable. Other: No intraperitoneal free fluid. No intraperitoneal free gas. No organized fluid collection. Musculoskeletal: No abdominal wall hernia or abnormality. Sclerotic lesion of the left iliac bone likely bone islands (5:31, 55). No suspicious lytic or blastic osseous lesions. No acute  displaced fracture. Right L5-S1 pseudoarthrosis. IMPRESSION: 1. Non-perforated acute appendicitis with associated appendicolith. 2. Possibly minimally obstructive elongated 6 mm right mid ureterolithiasis. Vague fullness of the right collecting system with no associated frank right proximal hydroureteronephrosis. 3. Nonobstructive punctate right nephrolithiasis. Electronically Signed   By: Morgane  Naveau M.D.   On: 11/13/2023 20:10    Assessment/Plan 43 y/o M w/ acute appendicitis  - Will proceed to the OR. We discussed the alternatives and potential risks of surgery, including but not limited to: bleeding, infection, damage to bowel or surrounding structures, need for more extensive operation, need for open surgery and need for additional procedures. All questions were addressed and consent was obtained.   - Zosyn prescribed by ED - NPO with MIVF   Trula Gable Surgery 11/13/2023, 9:30 PM Please see Amion for pager number during day hours 7:00am-4:30pm or 7:00am -11:30am on weekends

## 2023-11-14 ENCOUNTER — Encounter (HOSPITAL_COMMUNITY): Payer: Self-pay | Admitting: General Surgery

## 2023-11-14 ENCOUNTER — Other Ambulatory Visit: Payer: Self-pay

## 2023-11-14 DIAGNOSIS — K353 Acute appendicitis with localized peritonitis, without perforation or gangrene: Secondary | ICD-10-CM | POA: Diagnosis not present

## 2023-11-14 DIAGNOSIS — K3580 Unspecified acute appendicitis: Secondary | ICD-10-CM | POA: Diagnosis present

## 2023-11-14 LAB — CBC
HCT: 36.7 % — ABNORMAL LOW (ref 39.0–52.0)
Hemoglobin: 11.8 g/dL — ABNORMAL LOW (ref 13.0–17.0)
MCH: 26.3 pg (ref 26.0–34.0)
MCHC: 32.2 g/dL (ref 30.0–36.0)
MCV: 81.7 fL (ref 80.0–100.0)
Platelets: 280 10*3/uL (ref 150–400)
RBC: 4.49 MIL/uL (ref 4.22–5.81)
RDW: 16.4 % — ABNORMAL HIGH (ref 11.5–15.5)
WBC: 7.8 10*3/uL (ref 4.0–10.5)
nRBC: 0 % (ref 0.0–0.2)

## 2023-11-14 LAB — CREATININE, SERUM
Creatinine, Ser: 2.02 mg/dL — ABNORMAL HIGH (ref 0.61–1.24)
GFR, Estimated: 42 mL/min — ABNORMAL LOW (ref >60)

## 2023-11-14 MED ORDER — OXYCODONE HCL 5 MG PO TABS: 5.0000 mg | ORAL_TABLET | Freq: Once | ORAL | Status: DC | PRN

## 2023-11-14 MED ORDER — ONDANSETRON HCL 4 MG/2ML IJ SOLN
INTRAMUSCULAR | Status: DC | PRN
Start: 2023-11-14 — End: 2023-11-14
  Administered 2023-11-14: 4 mg via INTRAVENOUS

## 2023-11-14 MED ORDER — FENTANYL CITRATE (PF) 100 MCG/2ML IJ SOLN
25.0000 ug | INTRAMUSCULAR | Status: DC | PRN
Start: 2023-11-14 — End: 2023-11-14

## 2023-11-14 MED ORDER — METHOCARBAMOL 500 MG PO TABS: 500.0000 mg | ORAL_TABLET | Freq: Three times a day (TID) | ORAL | Status: DC | PRN

## 2023-11-14 MED ORDER — OXYCODONE HCL 5 MG PO TABS: 5.0000 mg | ORAL_TABLET | ORAL | Status: DC | PRN

## 2023-11-14 MED ORDER — OXYCODONE HCL 5 MG PO TABS: 5.0000 mg | ORAL_TABLET | Freq: Four times a day (QID) | ORAL | 0 refills | Status: AC | PRN

## 2023-11-14 MED ORDER — ACETAMINOPHEN 10 MG/ML IV SOLN: 1000.0000 mg | Freq: Once | INTRAVENOUS | Status: DC | PRN

## 2023-11-14 MED ORDER — HYDROMORPHONE HCL 1 MG/ML IJ SOLN: 1.0000 mg | INTRAMUSCULAR | Status: DC | PRN

## 2023-11-14 MED ORDER — METHOCARBAMOL 1000 MG/10ML IJ SOLN: 500.0000 mg | Freq: Three times a day (TID) | INTRAMUSCULAR | Status: DC | PRN

## 2023-11-14 MED ORDER — ENOXAPARIN SODIUM 40 MG/0.4ML IJ SOSY: 40.0000 mg | PREFILLED_SYRINGE | INTRAMUSCULAR | Status: DC

## 2023-11-14 MED ORDER — ACETAMINOPHEN 500 MG PO TABS
1000.0000 mg | ORAL_TABLET | Freq: Four times a day (QID) | ORAL | Status: DC
  Administered 2023-11-14 (×2): 1000 mg via ORAL
  Filled 2023-11-14 (×2): qty 2

## 2023-11-14 MED ORDER — OXYCODONE HCL 5 MG/5ML PO SOLN: 5.0000 mg | Freq: Once | ORAL | Status: DC | PRN

## 2023-11-14 MED ORDER — SUGAMMADEX SODIUM 200 MG/2ML IV SOLN
INTRAVENOUS | Status: DC | PRN
  Administered 2023-11-14 (×2): 100 mg via INTRAVENOUS

## 2023-11-14 MED ORDER — ACETAMINOPHEN 500 MG PO TABS: 1000.0000 mg | ORAL_TABLET | Freq: Four times a day (QID) | ORAL | Status: AC | PRN

## 2023-11-14 NOTE — Op Note (Signed)
 Preoperative diagnosis: acute appendicitis   Postoperative diagnosis: Same   Procedure: laparoscopic appendectomy  Surgeon: Armond Bertin, M.D.  Asst: None  Anesthesia: General endotracheal  Indications for procedure: Juan Salazar is a 43 y.o. male with symptoms of pain in right lower quadrant and nausea consistent with acute appendicitis. Confirmed by CT scan and laboratory values. The procedure, material risks, benefits and alternatives to surgery were discussed at length with the patient. The patient's questions were answered and the patient elected to proceed with the planned procedure.  Description of procedure: The patient was brought into the operating room, placed in the supine position. General endotracheal anesthesia was administered with endotracheal tube. The patient's left arm was tucked. All pressure points were offloaded by foam padding. The patient was prepped and draped in the usual sterile fashion.  A veress needle was inserted into the abdomen in the LUQ at Palmer's point. After verifying correct placement with aspiration and a saline drop test, the abdomen was insufflated to .  A 5-mm trocar was inserted into the abdomen just left of the umbilicus using an opti-view technique.  The laparoscope was introduced and the abdominal contents inspected to ensure that there were no signs of injury.  An additional 5-mm port was placed in the suprapubic position and a 12-mm port was placed in the LLQ under direct visualization.  All trocars sites were first anesthesized with 0.25% marcaine with epinephrine. Next the patient was placed in trendelenberg, rotated to the left. The omentum was retracted cephalad. The cecum and appendix were identified. The appendix was inflamed but there was no sign of perforation. The mesoappendix was dissected toward the base of the appendix using a bipolar cautery device.  The base of the appendix appeared healthy.  A 45mm blue load stapler was used  to transect the appendix at the base.  The appendix was placed in a specimen bag. The appendix was removed via the LLQ incision. 0 vicryl was used to close the fascial defect from the 12-mm port. All trocars were removed. All incisions were closed with 4-0 monocryl. The patient woke from anesthesia and was brought to PACU in stable condition.  Findings: Inflamed appendix  Specimen: appendix for permanent  Blood loss: 10mL  Local anesthesia: 25mL 0.25% marcaine w/ epinephrine  Complications: None  CASE DATA: Type of patient?: DOW CASE (Surgical Hospitalist [facility]) Status of Case? URGENT Add On Infection Present At Time Of Surgery (PATOS)?  INFECTION   Armond Bertin, M.D. General Surgery [facility], [place]

## 2023-11-14 NOTE — Transfer of Care (Signed)
 Immediate Anesthesia Transfer of Care Note  Patient: Juan Salazar  Procedure(s) Performed: APPENDECTOMY, LAPAROSCOPIC  Patient Location: PACU  Anesthesia Type:General  Level of Consciousness: awake, alert , and oriented  Airway & Oxygen Therapy: Patient Spontanous Breathing  Post-op Assessment: Report given to RN and Post -op Vital signs reviewed and stable  Post vital signs: Reviewed and stable  Last Vitals:  Vitals Value Taken Time  BP 125/86 11/14/23 0045  Temp 37.1 C 11/14/23 0030  Pulse 87 11/14/23 0051  Resp 14 11/14/23 0051  SpO2 93 % 11/14/23 0051  Vitals shown include unfiled device data.  Last Pain:  Vitals:   11/14/23 0045  TempSrc:   PainSc: 0-No pain         Complications: No notable events documented.

## 2023-11-14 NOTE — Progress Notes (Signed)
 Discharge instructions given to pt. Pt verbalized understanding of all teaching and had no further questions. Work note given to pt at discharge.

## 2023-11-14 NOTE — Discharge Summary (Signed)
    Patient ID: Juan Salazar [id number] [birth year] 43 y.o.  Admit date: 11/13/2023 Discharge date: 11/14/2023  Admitting Diagnosis: Acute appendicitis  Discharge Diagnosis Patient Active Problem List   Diagnosis Date Noted   Acute appendicitis 11/14/2023   S/P appendectomy 11/13/2023   Obesity (BMI 30-39.9) 09/19/2023   Allergic rhinitis 06/27/2023   UC (ulcerative colitis) (HCC) 05/29/2023   Muscle cramp 12/27/2022   Vitamin D  deficiency 12/27/2022   Encounter for general adult medical examination with abnormal findings 07/29/2022   Chronic diarrhea 07/29/2022    Consultants none  Reason for Admission: 43 year old male with a hx of UC (not on meds) who presents with 2 days of abdominal pain, nausea, and anorexia. He underwent a CT that showed a right-sided ureter stone without hydronephrosis as well as appendicitis. WBC 10. He is AF and HDS   Procedures Lap appy, Dr. Davonna Estes 5/27  Hospital Course:  The patient was admitted and underwent a laparoscopic appendectomy.  The patient tolerated the procedure well.  On POD 0, the patient was tolerating a regular diet, voiding well, mobilizing, and pain was controlled with oral pain medications.  The patient was stable for DC home at this time with appropriate follow up made.   Physical Exam: Abd: soft, appropriately tender, incisions c/d/I, ND  Allergies as of 11/14/2023   No Known Allergies      Medication List     TAKE these medications    acetaminophen  500 MG tablet Commonly known as: TYLENOL  Take 2 tablets (1,000 mg total) by mouth every 6 (six) hours as needed.   azelastine  0.1 % nasal spray Commonly known as: ASTELIN  Place 1 spray into both nostrils 2 (two) times daily as needed. Use in each nostril as directed   fluticasone  50 MCG/ACT nasal spray Commonly known as: FLONASE  Place 2 sprays into both nostrils daily.   Humira  (2 Pen) 40 MG/0.4ML pen Generic drug: adalimumab  Inject 0.4 mLs (40 mg total) into  the skin every 7 (seven) days.   oxyCODONE 5 MG immediate release tablet Commonly known as: Oxy IR/ROXICODONE Take 1 tablet (5 mg total) by mouth every 6 (six) hours as needed.   Vitamin D3 25 MCG (1000 UT) Caps Take 1 capsule (1,000 Units total) by mouth daily.   Wegovy  1 MG/0.5ML Soaj Generic drug: Semaglutide -Weight Management Inject 1 mg into the skin once a week.          Follow-up Information     Maczis, Puja Gosai, PA-C Follow up in 3 week(s).   Specialty: General Surgery Why: Office will call you with a follow up appointment, If you don't hear from the office, please call, Arrive 30 minutes prior to your appointment time, Please bring your insurance card and photo ID Contact information: [address] [place] [address] [facility] [place] [address] [phone]                 Signed: Marlin Simmonds, [facility] 11/14/2023, 10:49 AM Please see Amion for pager number during day hours 7:00am-4:30pm, 7-11:30am on Weekends

## 2023-11-14 NOTE — Discharge Instructions (Signed)

## 2023-11-14 NOTE — Plan of Care (Signed)

## 2023-11-15 LAB — SURGICAL PATHOLOGY

## 2023-11-18 LAB — CULTURE, BLOOD (ROUTINE X 2)
Culture: NO GROWTH
Culture: NO GROWTH
Special Requests: ADEQUATE

## 2023-11-19 NOTE — Anesthesia Postprocedure Evaluation (Signed)
 Anesthesia Post Note  Patient: Juan Salazar  Procedure(s) Performed: APPENDECTOMY, LAPAROSCOPIC     Patient location during evaluation: PACU Anesthesia Type: General Level of consciousness: awake and alert Pain management: pain level controlled Vital Signs Assessment: post-procedure vital signs reviewed and stable Respiratory status: spontaneous breathing, nonlabored ventilation and respiratory function stable Cardiovascular status: blood pressure returned to baseline and stable Postop Assessment: no apparent nausea or vomiting Anesthetic complications: no   No notable events documented.               Alauna Hayden

## 2023-11-24 ENCOUNTER — Ambulatory Visit: Payer: Medicaid Other | Admitting: Family Medicine

## 2023-11-29 ENCOUNTER — Telehealth: Payer: Self-pay | Admitting: Internal Medicine

## 2023-11-29 ENCOUNTER — Other Ambulatory Visit: Payer: Self-pay | Admitting: Internal Medicine

## 2023-11-29 DIAGNOSIS — R7303 Prediabetes: Secondary | ICD-10-CM

## 2023-11-29 DIAGNOSIS — E669 Obesity, unspecified: Secondary | ICD-10-CM

## 2023-11-29 MED ORDER — WEGOVY 1.7 MG/0.75ML ~~LOC~~ SOAJ
1.7000 mg | SUBCUTANEOUS | 0 refills | Status: DC
Start: 2023-11-29 — End: 2024-01-10

## 2023-11-29 NOTE — Telephone Encounter (Signed)
 Refill request sent to provider.

## 2023-11-29 NOTE — Telephone Encounter (Signed)
 Copied from CRM [id number]. Topic: Clinical - Medication Refill >> Nov 29, 2023 11:13 AM Turkey B wrote: Medication: Semaglutide -Weight Management (WEGOVY ) 1 MG/0.5ML  Has the patient contacted their pharmacy? no Pt's wife states they have to call it in. Pt is needing the next dose up, and she doesn't know what the next dose it  This is the patient's preferred pharmacy:  [facility] [address], [place] - [address] [place] [address] Phone: [phone] Fax: [phone]  Is this the correct pharmacy yes  Has the prescription been filled recently? no  Is the patient out of the medication? yes  Has the patient been seen for an appointment in the last year OR does the patient have an upcoming appointment? yes  Can we respond through MyChart? yes  Agent: Please be advised that Rx refills may take up to 3 business days. We ask that you follow-up with your pharmacy.

## 2023-11-29 NOTE — Telephone Encounter (Signed)
 Copied from CRM [id number]. Topic: General - Other >> Nov 29, 2023 11:17 AM Turkey B wrote: Reason for CRM: pt's wife called in for Wegovy , refill , says needs the next dose up. I put in a refill request stating this, but sending this message also

## 2023-12-26 ENCOUNTER — Ambulatory Visit: Payer: Medicaid Other

## 2023-12-26 VITALS — BP 116/74 | HR 103 | Ht 68.0 in | Wt 215.1 lb

## 2023-12-26 DIAGNOSIS — R7989 Other specified abnormal findings of blood chemistry: Secondary | ICD-10-CM

## 2023-12-26 DIAGNOSIS — Z6832 Body mass index (BMI) 32.0-32.9, adult: Secondary | ICD-10-CM | POA: Diagnosis not present

## 2023-12-26 DIAGNOSIS — D619 Aplastic anemia, unspecified: Secondary | ICD-10-CM | POA: Diagnosis not present

## 2023-12-26 DIAGNOSIS — E669 Obesity, unspecified: Secondary | ICD-10-CM

## 2023-12-26 DIAGNOSIS — R7303 Prediabetes: Secondary | ICD-10-CM

## 2023-12-26 NOTE — Progress Notes (Signed)
 Established Patient Office Visit  Subjective   Patient ID: Juan Salazar, male    DOB: 1981-02-06  Age: 43 y.o. MRN: [id number]  Chief Complaint  Patient presents with   Medical Management of Chronic Issues    Pt here for 6 month follow     HPI  Patient Active Problem List   Diagnosis Date Noted   Acute appendicitis 11/14/2023   S/P appendectomy 11/13/2023   Obesity (BMI 30-39.9) 09/19/2023   Allergic rhinitis 06/27/2023   UC (ulcerative colitis) (HCC) 05/29/2023   Muscle cramp 12/27/2022   Vitamin D  deficiency 12/27/2022   Encounter for general adult medical examination with abnormal findings 07/29/2022   Chronic diarrhea 07/29/2022      ROS    Objective:     BP 116/74   Pulse (!) 103   Ht 5' 8 (1.727 m)   Wt 215 lb 1.3 oz (97.6 kg)   SpO2 97%   BMI 32.70 kg/m  BP Readings from Last 3 Encounters:  12/26/23 116/74  11/14/23 110/70  09/18/23 117/79   Wt Readings from Last 3 Encounters:  12/26/23 215 lb 1.3 oz (97.6 kg)  11/13/23 242 lb 8.1 oz (110 kg)  09/18/23 243 lb (110.2 kg)     Physical Exam Vitals and nursing note reviewed.  Constitutional:      Appearance: Normal appearance. He is obese.  HENT:     Head: Normocephalic.  Eyes:     Extraocular Movements: Extraocular movements intact.     Pupils: Pupils are equal, round, and reactive to light.  Cardiovascular:     Rate and Rhythm: Normal rate and regular rhythm.  Pulmonary:     Effort: Pulmonary effort is normal.     Breath sounds: Normal breath sounds.  Musculoskeletal:     Cervical back: Normal range of motion and neck supple.  Neurological:     Mental Status: He is alert and oriented to person, place, and time.  Psychiatric:        Mood and Affect: Mood normal.        Thought Content: Thought content normal.      No results found for any visits on 12/26/23.  Last CBC Lab Results  Component Value Date   WBC 7.8 11/14/2023   HGB 11.8 (L) 11/14/2023   HCT 36.7 (L) 11/14/2023    MCV 81.7 11/14/2023   MCH 26.3 11/14/2023   RDW 16.4 (H) 11/14/2023   PLT 280 11/14/2023   Last metabolic panel Lab Results  Component Value Date   GLUCOSE 96 11/13/2023   NA 134 (L) 11/13/2023   K 4.3 11/13/2023   CL 102 11/13/2023   CO2 23 11/13/2023   BUN 11 11/13/2023   CREATININE 2.02 (H) 11/14/2023   GFRNONAA 42 (L) 11/14/2023   CALCIUM 8.7 (L) 11/13/2023   PROT 7.3 07/29/2022   ALBUMIN 4.4 07/29/2022   LABGLOB 2.9 07/29/2022   AGRATIO 1.5 07/29/2022   BILITOT <0.2 07/29/2022   ALKPHOS 121 07/29/2022   AST 26 07/29/2022   ALT 26 07/29/2022   ANIONGAP 9 11/13/2023   Last lipids Lab Results  Component Value Date   CHOL 174 07/29/2022   HDL 29 (L) 07/29/2022   LDLCALC 82 07/29/2022   TRIG 386 (H) 07/29/2022   CHOLHDL 6.0 (H) 07/29/2022   Last hemoglobin A1c Lab Results  Component Value Date   HGBA1C 5.8 (H) 07/29/2022      The 10-year ASCVD risk score (Arnett DK, et al., 2019) is: 1.7%  Assessment & Plan:   Problem List Items Addressed This Visit       Other   Obesity (BMI 30-39.9) - Primary   He has lost over 30 lbs since starting on Wegovy .  He reports changing his eating habits and has increased water intake.  He is physically active with his job.  He denies needing refills at this time.        Other Visit Diagnoses       Elevated serum creatinine       Recheck labs to reevaluate kidney function.  he reports that he has been trying to increase water intake.   Relevant Orders   CMP14+EGFR     Aplastic anemia (HCC)       recheck CBC to reevaluate recent abnormalities.   Relevant Orders   CBC     Prediabetes       recheck A1C.  A1c last year was 5.8   Relevant Orders   CMP14+EGFR   HgB A1c       No follow-ups on file.    Leita Longs, FNP

## 2023-12-26 NOTE — Assessment & Plan Note (Signed)
 He has lost over 30 lbs since starting on Wegovy .  He reports changing his eating habits and has increased water intake.  He is physically active with his job.  He denies needing refills at this time.

## 2024-01-01 ENCOUNTER — Ambulatory Visit: Admitting: Allergy & Immunology

## 2024-01-10 ENCOUNTER — Other Ambulatory Visit: Payer: Self-pay

## 2024-01-10 DIAGNOSIS — E669 Obesity, unspecified: Secondary | ICD-10-CM

## 2024-01-10 DIAGNOSIS — R7303 Prediabetes: Secondary | ICD-10-CM

## 2024-01-10 NOTE — Telephone Encounter (Unsigned)
 Copied from CRM #[id number]. Topic: Clinical - Medication Refill >> Jan 10, 2024 12:55 PM Emylou G wrote: Medication: Semaglutide -Weight Management (WEGOVY ) 1.7 MG/0.75ML SOAJ  Has the patient contacted their pharmacy? No (Agent: If no, request that the patient contact the pharmacy for the refill. If patient does not wish to contact the pharmacy document the reason why and proceed with request.) (Agent: If yes, when and what did the pharmacy advise?)  This is the patient's preferred pharmacy:  [facility] [address] [place] - [address] [place] [address] Phone: [phone] Fax: [phone]   Is this the correct pharmacy for this prescription? Yes If no, delete pharmacy and type the correct one.   Has the prescription been filled recently? no  Is the patient out of the medication? Yes  Has the patient been seen for an appointment in the last year OR does the patient have an upcoming appointment? Yes  Can we respond through MyChart? Yes  Agent: Please be advised that Rx refills may take up to 3 business days. We ask that you follow-up with your pharmacy.

## 2024-01-11 MED ORDER — WEGOVY 1.7 MG/0.75ML ~~LOC~~ SOAJ
1.7000 mg | SUBCUTANEOUS | 0 refills | Status: DC
Start: 2024-01-11 — End: 2024-01-15

## 2024-01-15 ENCOUNTER — Telehealth: Payer: Self-pay

## 2024-01-15 ENCOUNTER — Other Ambulatory Visit: Payer: Self-pay

## 2024-01-15 DIAGNOSIS — E669 Obesity, unspecified: Secondary | ICD-10-CM

## 2024-01-15 MED ORDER — WEGOVY 2.4 MG/0.75ML ~~LOC~~ SOAJ
2.4000 mg | SUBCUTANEOUS | 2 refills | Status: AC
Start: 2024-01-15 — End: ?

## 2024-01-15 NOTE — Telephone Encounter (Signed)
 Copied from CRM #[id number]. Topic: Clinical - Prescription Issue >> Jan 15, 2024 11:34 AM Avram MATSU wrote: Reason for CRM: wife of the pt stated his medication for Semaglutide -Weight Management (WEGOVY ) 1.7 MG/0.75ML SOAJ [[id number]] was suppose to be for 2.4mg   Please call wife back to clarify [phone]

## 2024-06-20 ENCOUNTER — Encounter: Payer: Self-pay | Admitting: Gastroenterology
